# Patient Record
Sex: Male | Born: 1945 | Race: White | Hispanic: No | Marital: Married | Smoking: Former smoker
Health system: Southern US, Community
[De-identification: ages and names within clinical notes are randomized; demographics above are authoritative.]

## PROBLEM LIST (undated history)

## (undated) DIAGNOSIS — N289 Disorder of kidney and ureter, unspecified: Secondary | ICD-10-CM

## (undated) DIAGNOSIS — J449 Chronic obstructive pulmonary disease, unspecified: Secondary | ICD-10-CM

## (undated) DIAGNOSIS — I10 Essential (primary) hypertension: Secondary | ICD-10-CM

---

## 2010-09-06 ENCOUNTER — Ambulatory Visit: Payer: Self-pay | Admitting: Nurse Practitioner

## 2010-09-06 IMAGING — CR DG CHEST 2V
1 series · 3 of 3 positions shown · non-contrast
Comparison: none

REASON FOR EXAM: asymmetric rightsided densities on cxr in September 2008
COMMENTS:

[Series 1: view not recorded · 0.17mm/px · 3 of 3 slices shown]
[im 1/3]
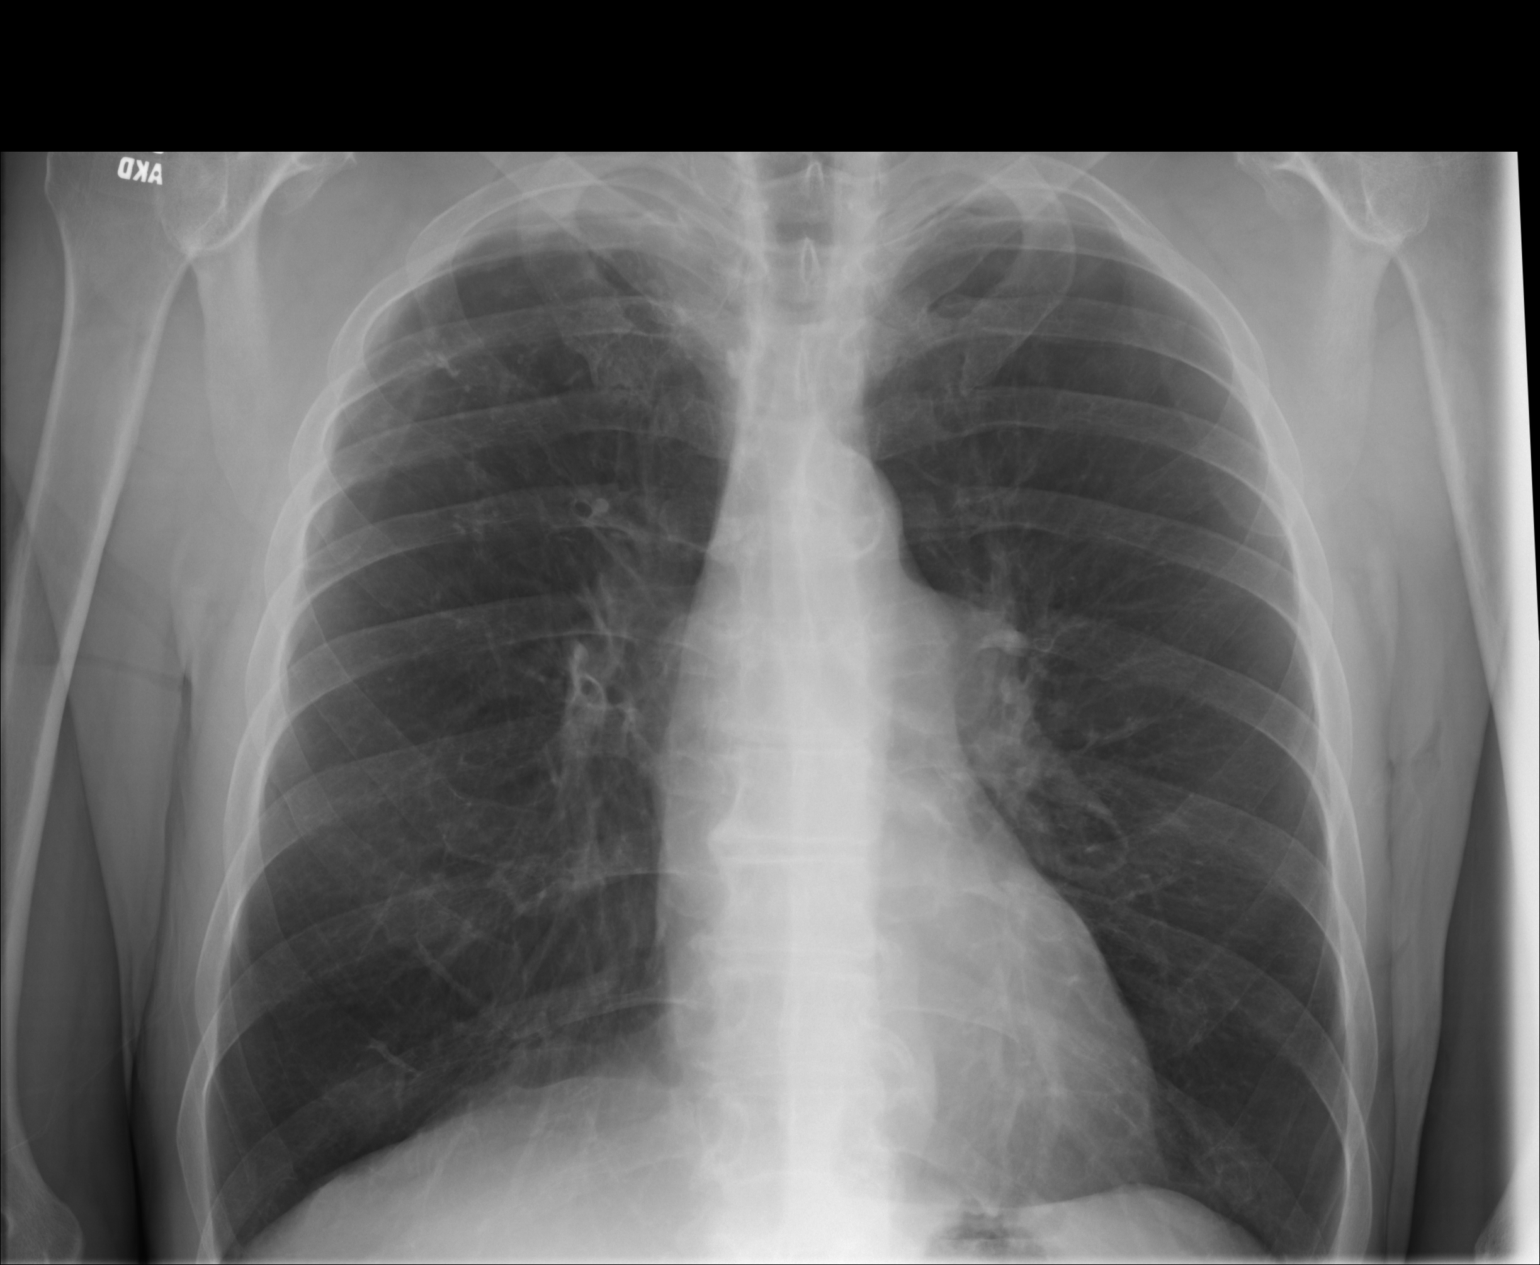
[im 2/3]
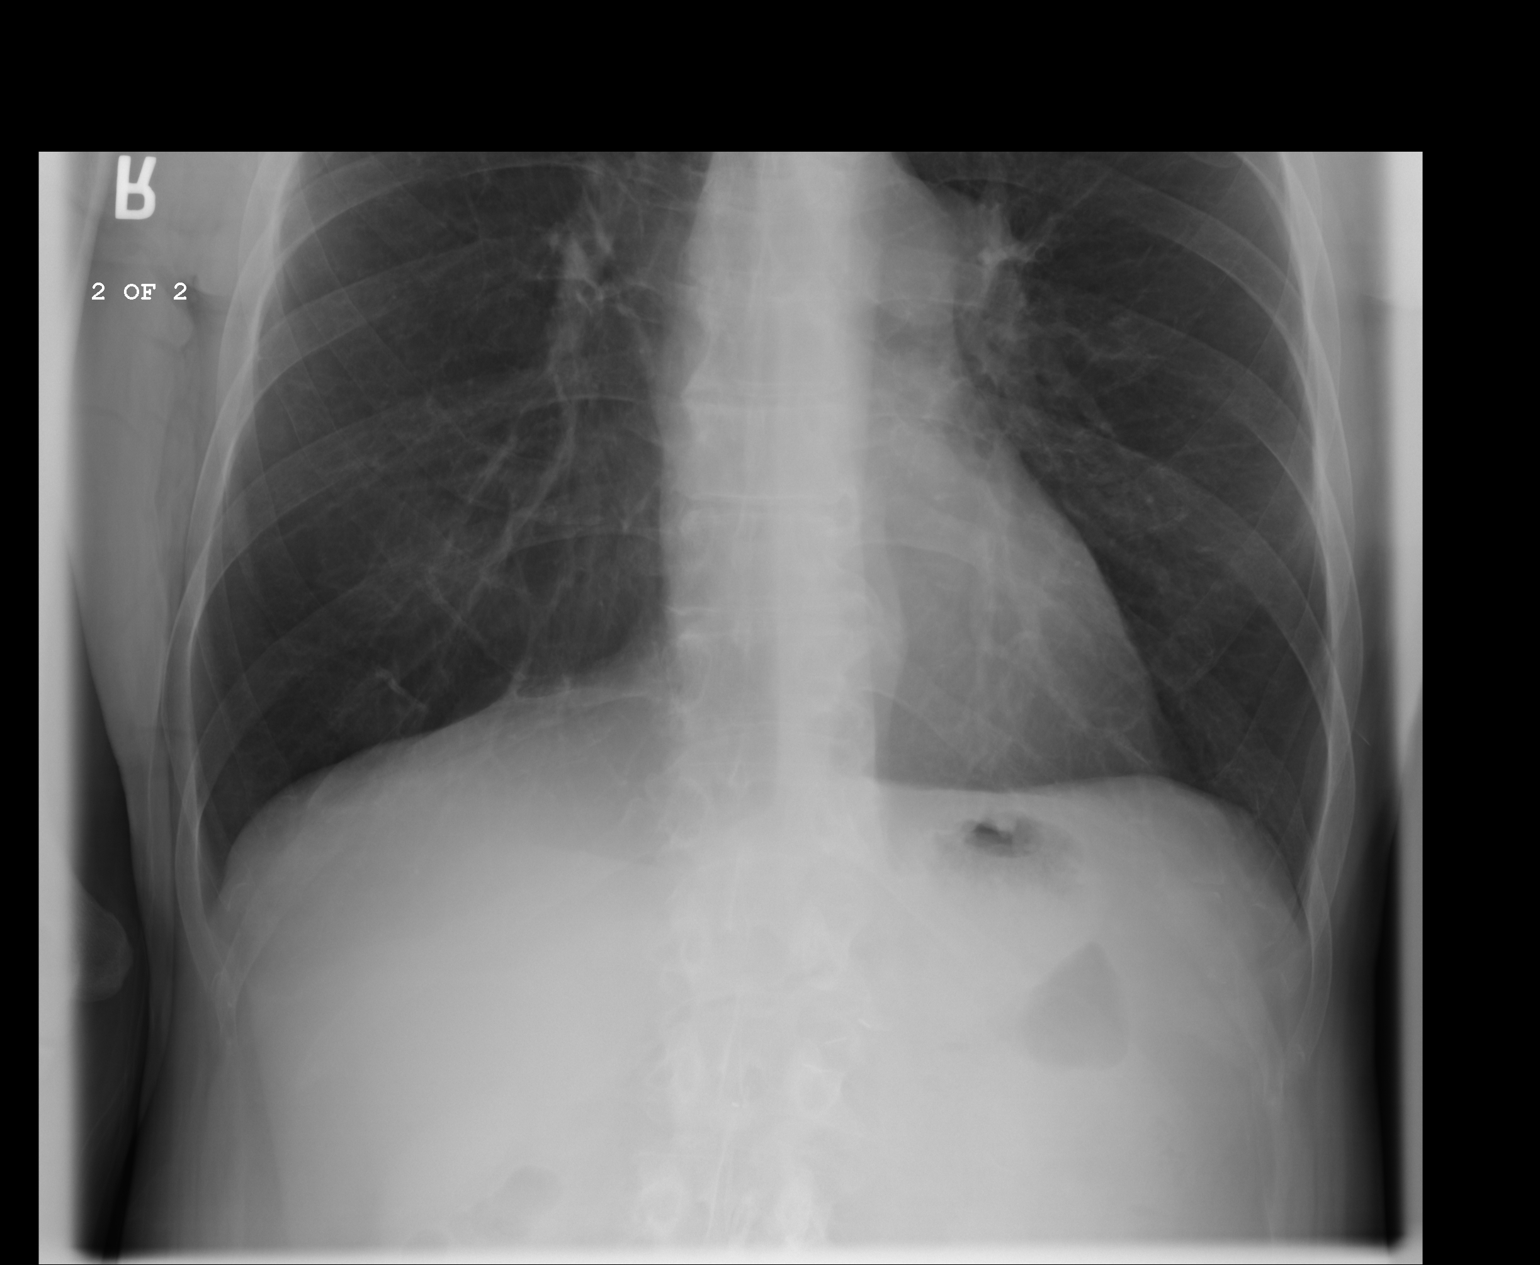
[im 3/3]
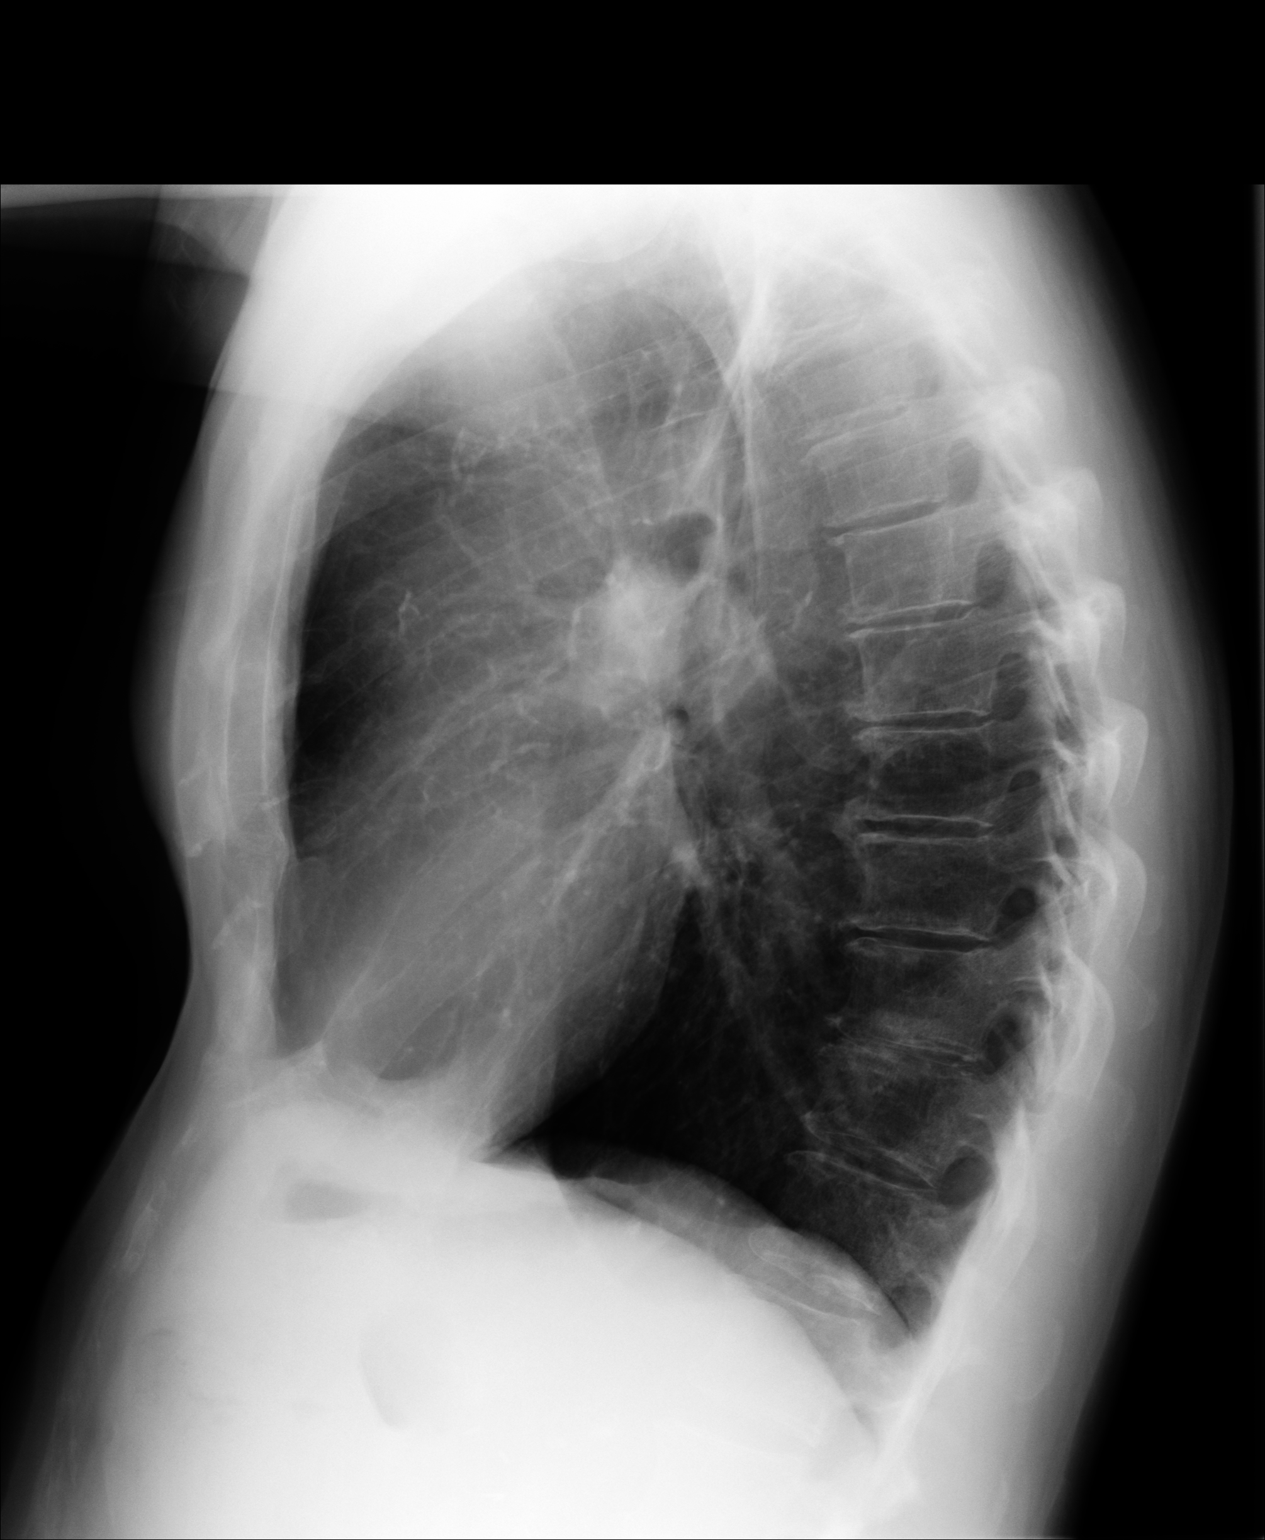

[3 of 3 positions shown; findings below may reference images not displayed]

PROCEDURE:     DXR - DXR CHEST PA (OR AP) AND LATERAL  - September 06, 2010  [DATE]

RESULT:     There is no previous exam for comparison.

The lungs are clear. The heart and pulmonary vessels are normal. The bony
and mediastinal structures are unremarkable. There is no effusion. There is
no pneumothorax or evidence of congestive failure. There is hyperinflation
which may reflect reactive airway disease or COPD.
IMPRESSION: No acute cardiopulmonary disease. Hyperinflation most
likely secondary to COPD.

## 2018-07-25 ENCOUNTER — Emergency Department: Payer: Medicare HMO

## 2018-07-25 ENCOUNTER — Encounter: Payer: Self-pay | Admitting: Emergency Medicine

## 2018-07-25 ENCOUNTER — Other Ambulatory Visit: Payer: Self-pay

## 2018-07-25 ENCOUNTER — Observation Stay
Admission: EM | Admit: 2018-07-25 | Discharge: 2018-07-26 | Disposition: A | Discharge status: Home / Self Care | Payer: Medicare HMO | Attending: Internal Medicine | Admitting: Internal Medicine

## 2018-07-25 DIAGNOSIS — Z66 Do not resuscitate: Secondary | ICD-10-CM | POA: Diagnosis not present

## 2018-07-25 DIAGNOSIS — I129 Hypertensive chronic kidney disease with stage 1 through stage 4 chronic kidney disease, or unspecified chronic kidney disease: Secondary | ICD-10-CM | POA: Insufficient documentation

## 2018-07-25 DIAGNOSIS — E860 Dehydration: Secondary | ICD-10-CM | POA: Insufficient documentation

## 2018-07-25 DIAGNOSIS — R918 Other nonspecific abnormal finding of lung field: Secondary | ICD-10-CM | POA: Insufficient documentation

## 2018-07-25 DIAGNOSIS — E875 Hyperkalemia: Principal | ICD-10-CM | POA: Insufficient documentation

## 2018-07-25 DIAGNOSIS — E871 Hypo-osmolality and hyponatremia: Secondary | ICD-10-CM | POA: Diagnosis not present

## 2018-07-25 DIAGNOSIS — E876 Hypokalemia: Secondary | ICD-10-CM

## 2018-07-25 DIAGNOSIS — Z87891 Personal history of nicotine dependence: Secondary | ICD-10-CM | POA: Diagnosis not present

## 2018-07-25 DIAGNOSIS — R911 Solitary pulmonary nodule: Secondary | ICD-10-CM

## 2018-07-25 DIAGNOSIS — N183 Chronic kidney disease, stage 3 (moderate): Secondary | ICD-10-CM | POA: Diagnosis not present

## 2018-07-25 HISTORY — DX: Disorder of kidney and ureter, unspecified: N28.9

## 2018-07-25 HISTORY — DX: Essential (primary) hypertension: I10

## 2018-07-25 LAB — CBC
HCT: 37.5 % — ABNORMAL LOW (ref 39.0–52.0)
Hemoglobin: 13.5 g/dL (ref 13.0–17.0)
MCH: 40.1 pg — AB (ref 26.0–34.0)
MCHC: 36 g/dL (ref 30.0–36.0)
MCV: 111.3 fL — ABNORMAL HIGH (ref 80.0–100.0)
Platelets: 298 10*3/uL (ref 150–400)
RBC: 3.37 MIL/uL — ABNORMAL LOW (ref 4.22–5.81)
RDW: 12.4 % (ref 11.5–15.5)
WBC: 7.2 10*3/uL (ref 4.0–10.5)
nRBC: 0 % (ref 0.0–0.2)

## 2018-07-25 LAB — COMPREHENSIVE METABOLIC PANEL
ALBUMIN: 3.3 g/dL — AB (ref 3.5–5.0)
ALK PHOS: 73 U/L (ref 38–126)
ALT: 12 U/L (ref 0–44)
AST: 26 U/L (ref 15–41)
Anion gap: 10 (ref 5–15)
BILIRUBIN TOTAL: 1 mg/dL (ref 0.3–1.2)
BUN: 19 mg/dL (ref 8–23)
CALCIUM: 8.1 mg/dL — AB (ref 8.9–10.3)
CO2: 21 mmol/L — ABNORMAL LOW (ref 22–32)
CREATININE: 1.35 mg/dL — AB (ref 0.61–1.24)
Chloride: 94 mmol/L — ABNORMAL LOW (ref 98–111)
GFR calc non Af Amer: 52 mL/min — ABNORMAL LOW (ref >60)
GLUCOSE: 107 mg/dL — AB (ref 70–99)
POTASSIUM: 5.3 mmol/L — AB (ref 3.5–5.1)
SODIUM: 125 mmol/L — AB (ref 135–145)
Total Protein: 6.3 g/dL — ABNORMAL LOW (ref 6.5–8.1)

## 2018-07-25 LAB — TROPONIN I: Troponin I: <0.03 ng/mL (ref <0.03)

## 2018-07-25 IMAGING — CR DG CHEST 2V
2 series · 2 of 2 positions shown · non-contrast
Comparison: 09/06/2010

CLINICAL DATA: Hyponatremia

EXAM:
CHEST - 2 VIEW

[chest lat]
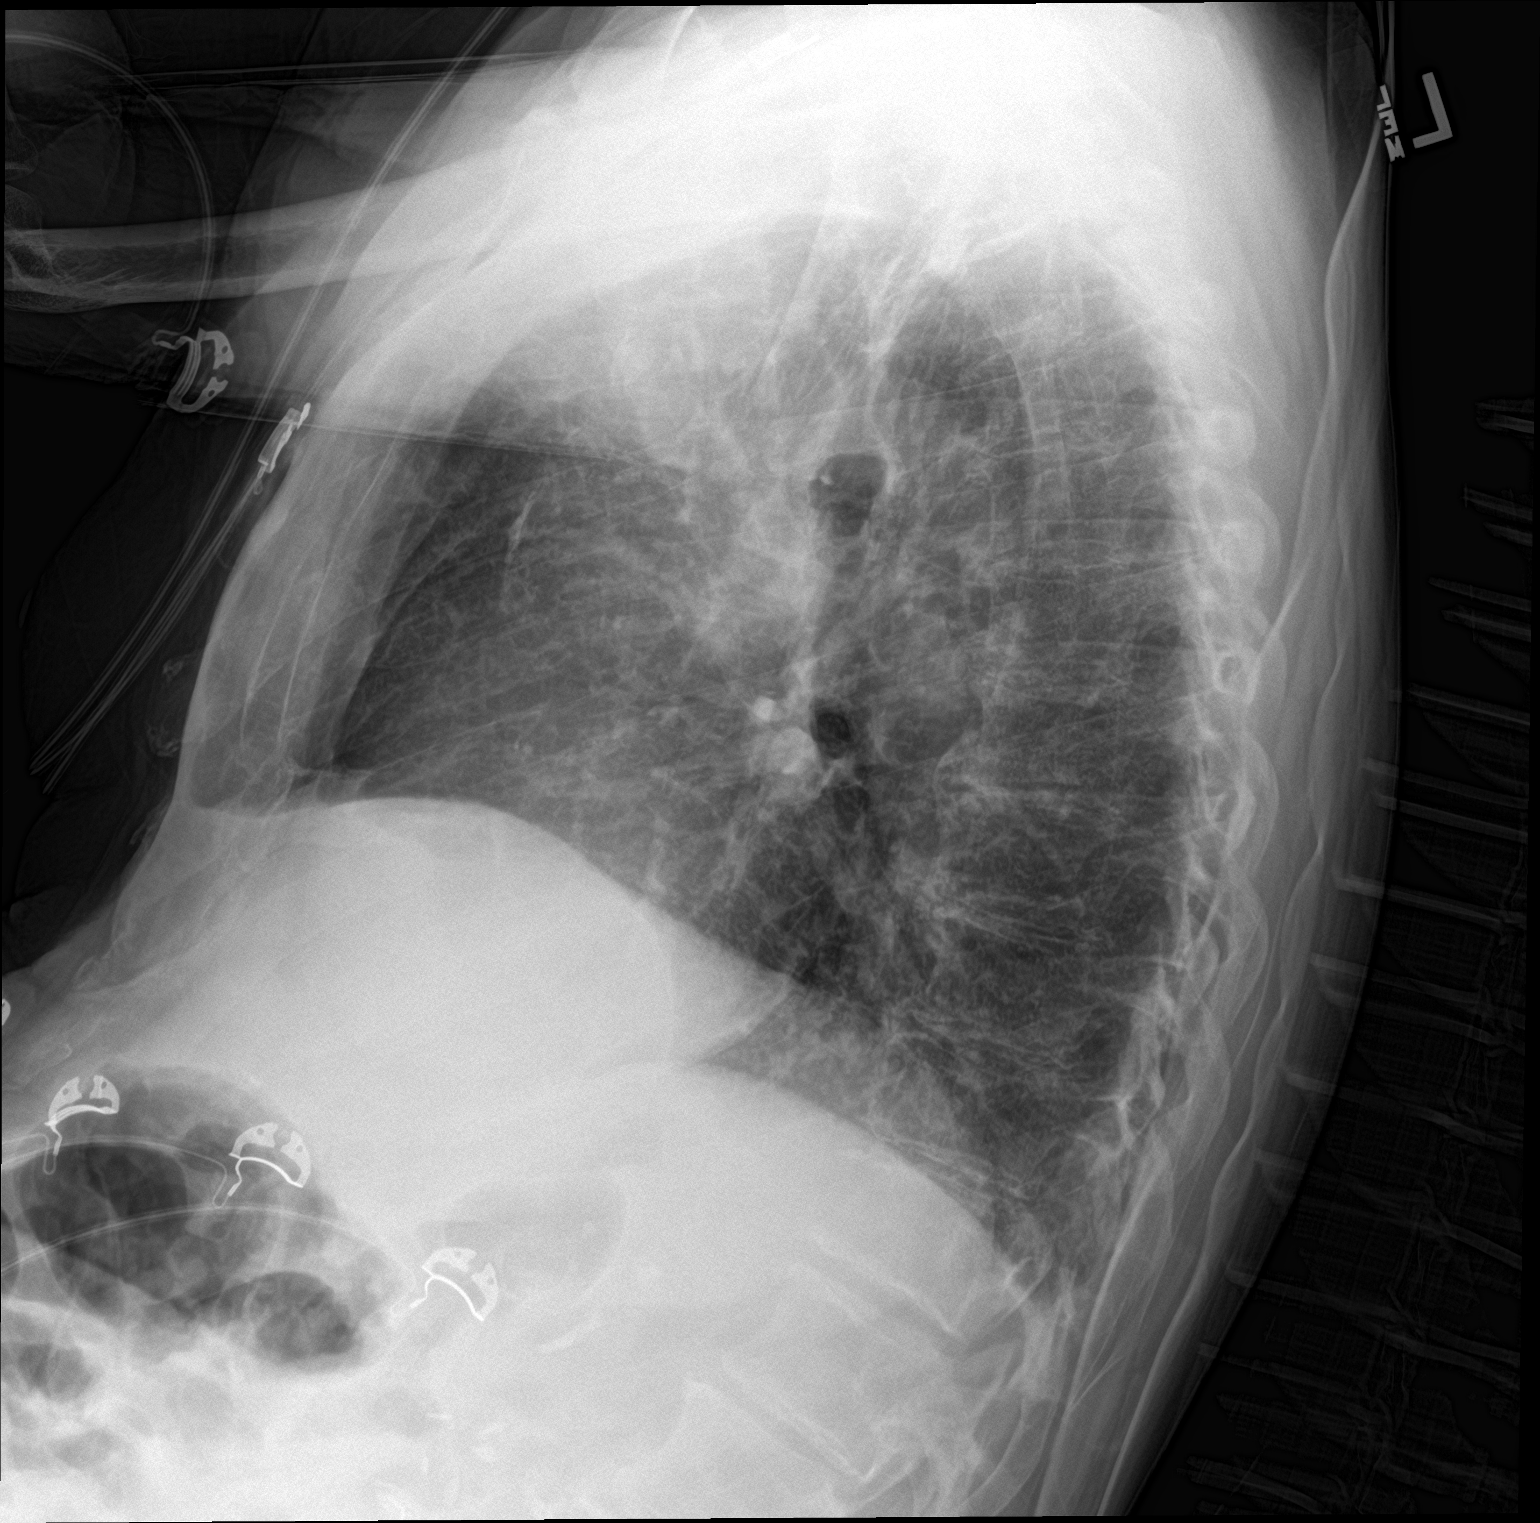

[chest ap]
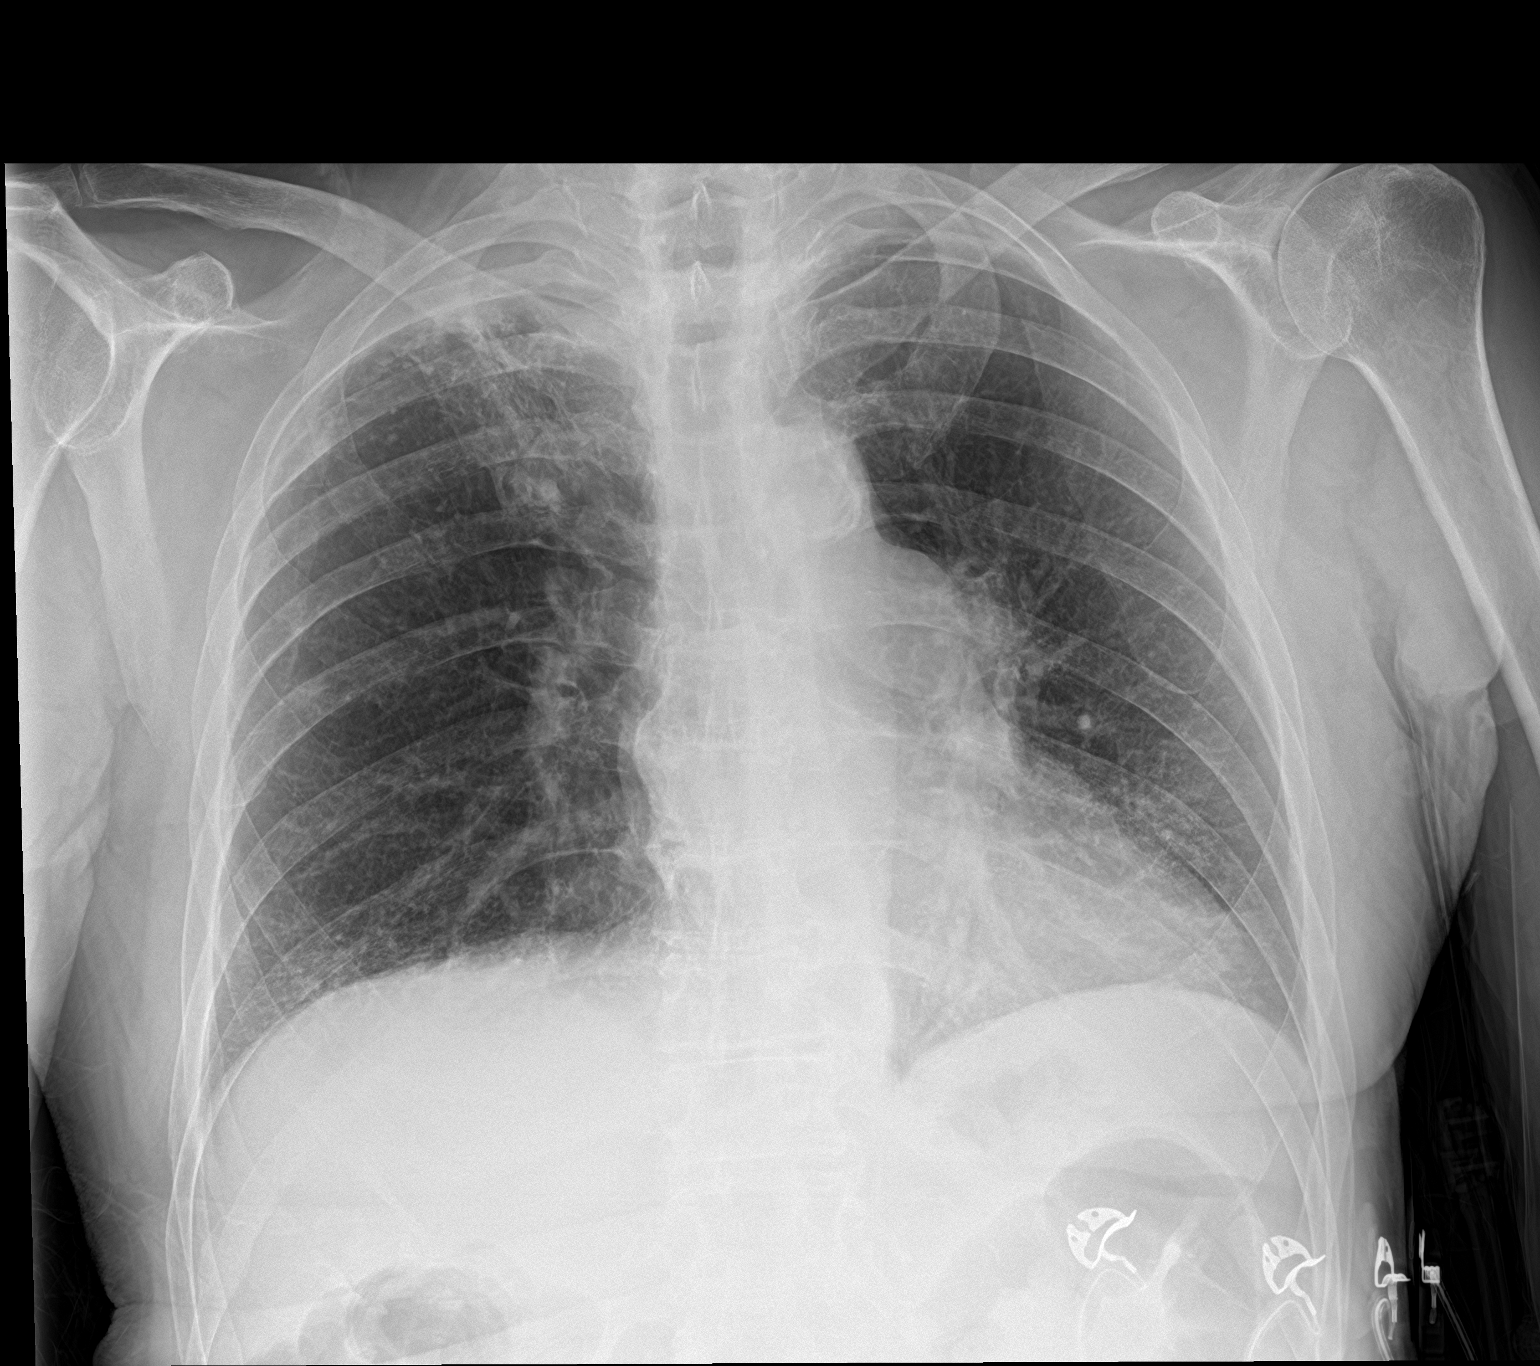

[2 of 2 positions shown; findings below may reference images not displayed]

FINDINGS: Progressed right apical pleural and parenchymal disease. Left lung
is clear. Normal heart size with aortic atherosclerosis. No
pneumothorax. Degenerative changes of the spine.
IMPRESSION: Progressed right apical pleural and parenchymal opacity since 4804
comparison. Given history, recommend CT chest for further
evaluation.

## 2018-07-25 IMAGING — CT CT CHEST W/ CM
2 of 3 series · 15 of 36 positions shown, 18 images · IV contrast (omnipaque)
Comparison: 07/25/2018 chest radiograph

CLINICAL DATA: 72 y/o M; right lung apex pleuroparenchymal opacity.

EXAM:
CT CHEST WITH CONTRAST
TECHNIQUE: Multidetector CT imaging of the chest was performed during
intravenous contrast administration.
CONTRAST:  75mL OMNIPAQUE IOHEXOL 300 MG/ML  SOLN

[Series 2: axial st · axial · 0.61mm/px · z∈[-510,-232]mm · 12 of 165 slices shown, 15 images]
[im 13/165  mediastinal]
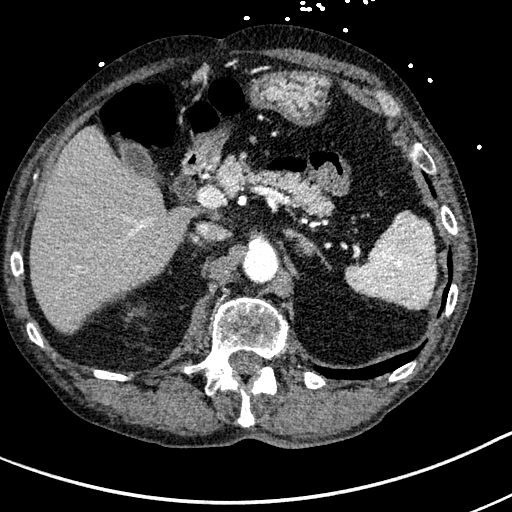
[im 13/165  lung]
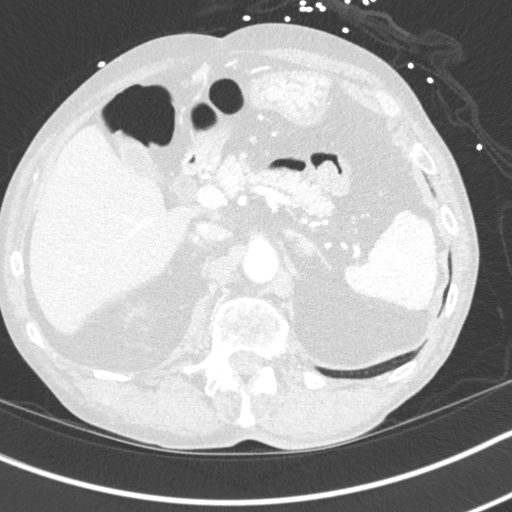
[im 25/165  lung]
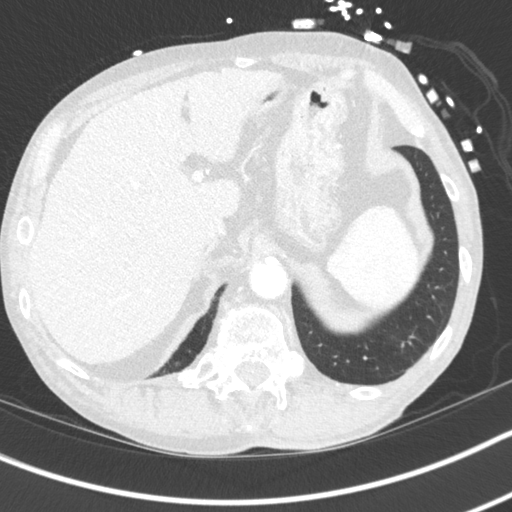
[im 37/165  lung]
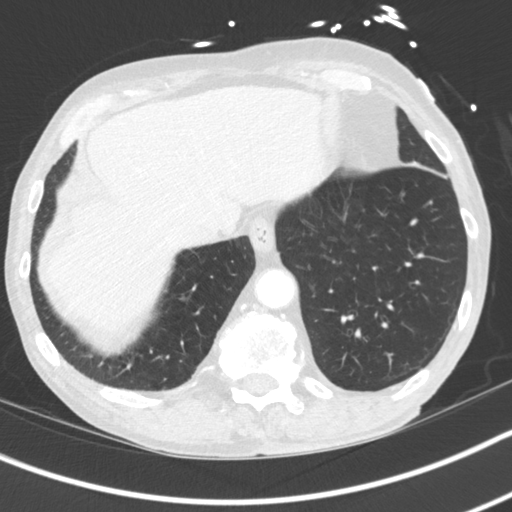
[im 49/165  lung]
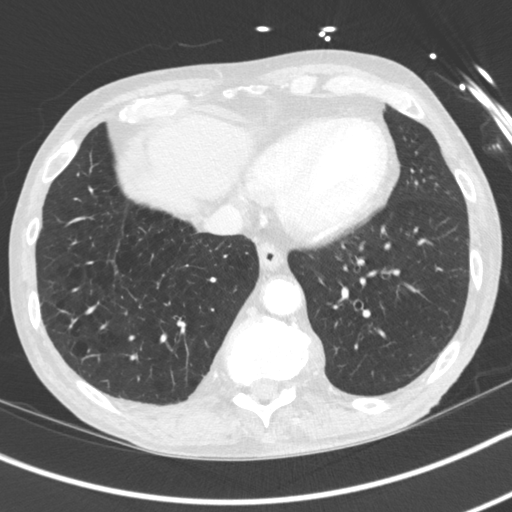
[im 61/165  mediastinal]
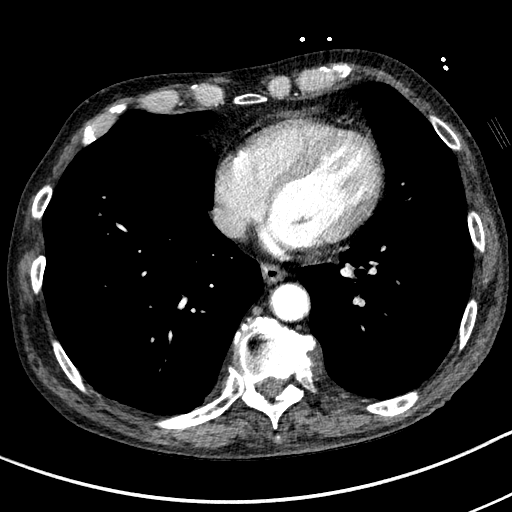
[im 61/165  lung]
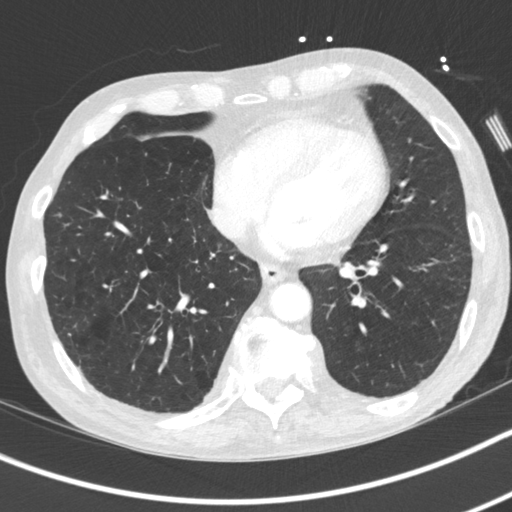
[im 73/165  lung]
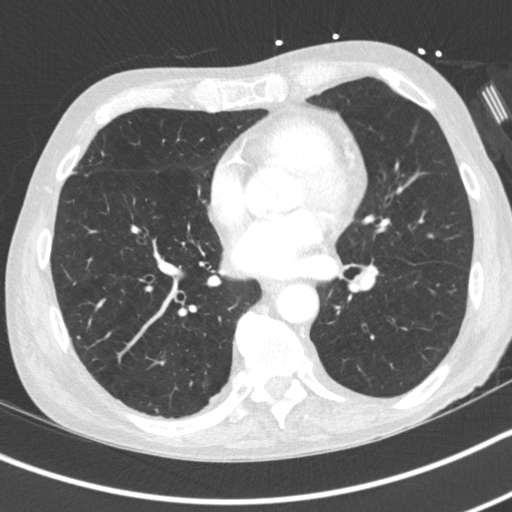
[im 92/165  lung]
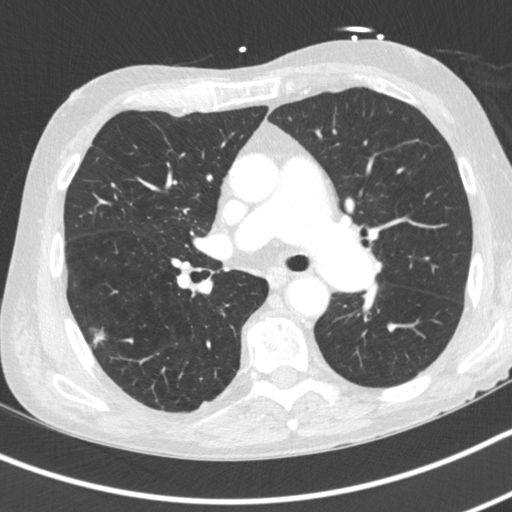
[im 104/165  lung]
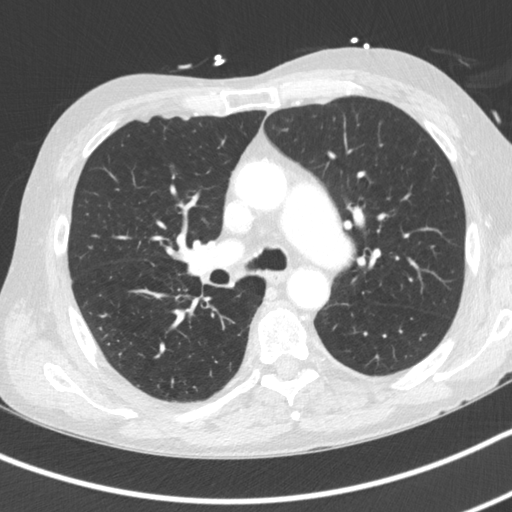
[im 116/165  mediastinal]
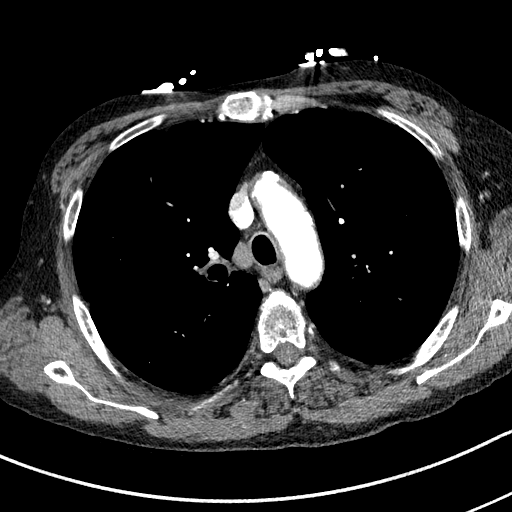
[im 116/165  lung]
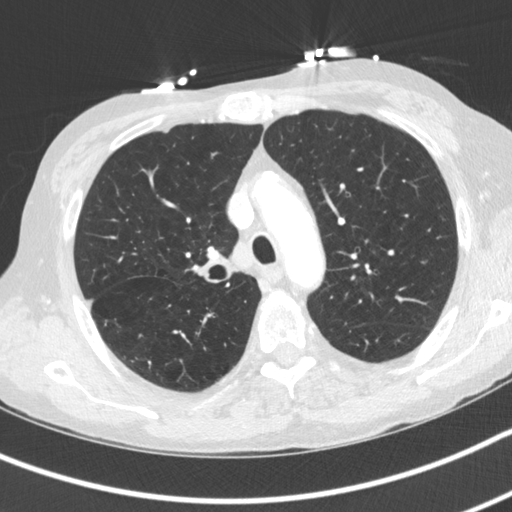
[im 128/165  lung]
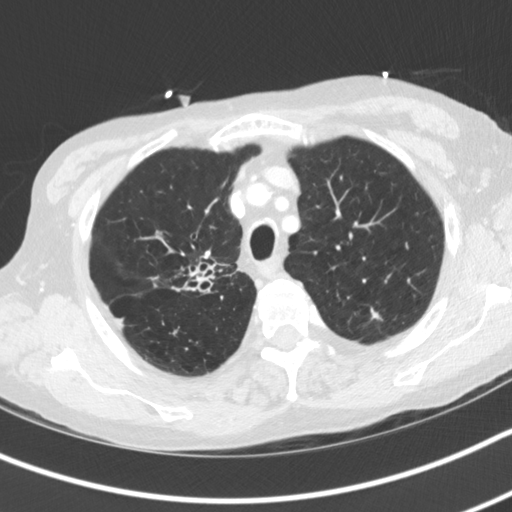
[im 140/165  lung]
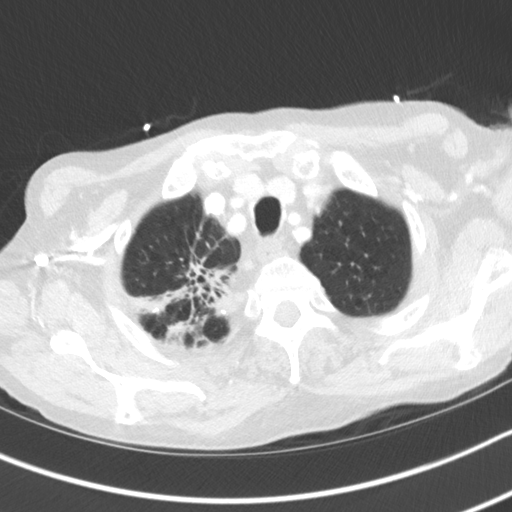
[im 152/165  lung]
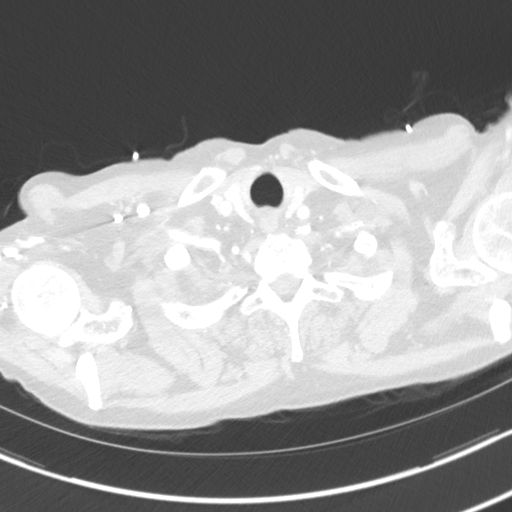

[Series 5: coronal · coronal · 0.60mm/px · 3 of 121 slices shown]
[im 25/121  lung]
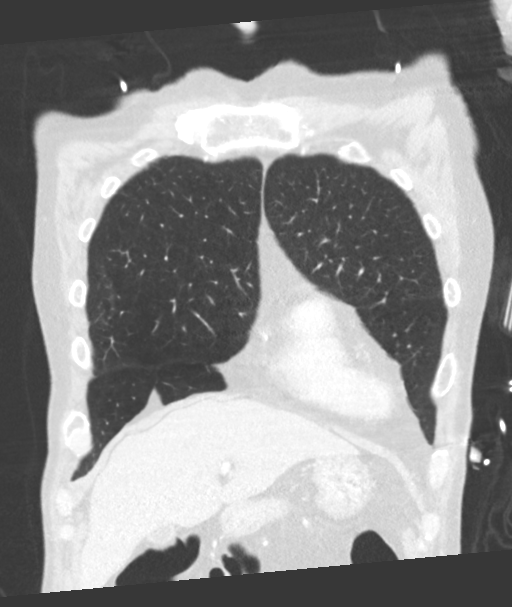
[im 49/121  lung]
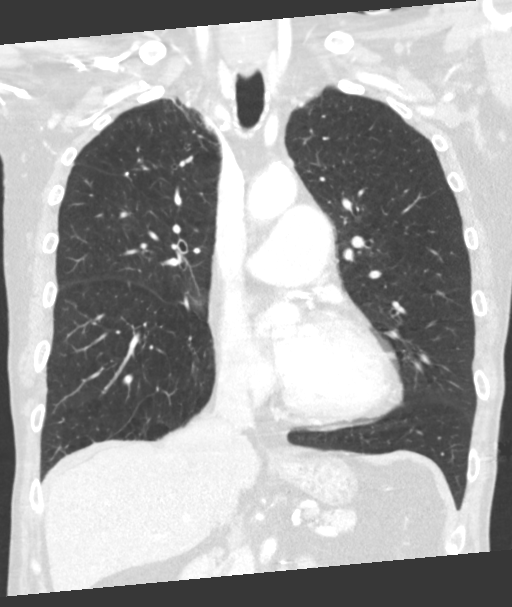
[im 73/121  lung]
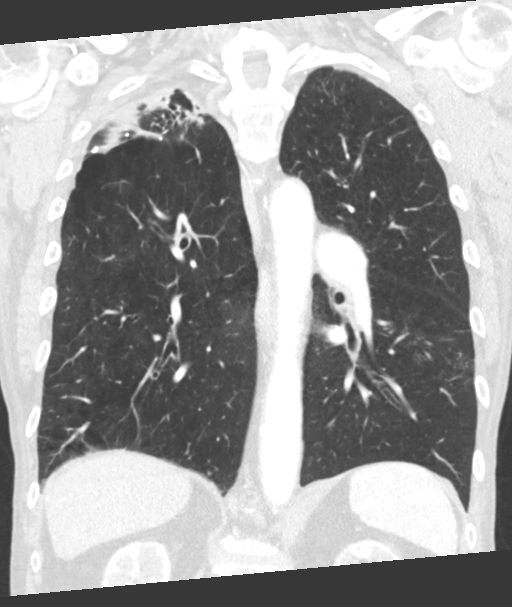

[15 of 36 positions shown; findings below may reference images not displayed]

FINDINGS: Cardiovascular: Normal caliber thoracic aorta and main pulmonary
artery. Normal heart size. No pericardial effusion. Severe coronary
artery calcific atherosclerosis. Moderate calcific atherosclerosis
of the aorta. Calcified plaque of the right brachiocephalic artery
bifurcation with mild stenosis of the subclavian artery and common
carotid artery origins.

Mediastinum/Nodes: No enlarged mediastinal, hilar, or axillary lymph
nodes. Thyroid gland, trachea, and esophagus demonstrate no
significant findings.

Lungs/Pleura: 15 x 11 mm nodule in the right lower lobe (series 3,
image 72) with spiculation. 5 x 5 mm nodule in the left upper lobe
(series 3, image 39). Several additional scattered 2-3 mm nodules
are present at the peripheries of the lungs. Emphysema. No effusion
or pneumothorax. Right lung apex opacity corresponds to focal severe
bronchiectasis and segmental scarring of the right upper lobe.

Upper Abdomen: Partially visualize cyst measuring 12 mm within the
left interpolar kidney. Cholelithiasis.

Musculoskeletal: Multiple chronic right-sided rib fractures.
IMPRESSION: 1. Right lung apex opacity on radiograph corresponds to severe focal
bronchiectasis and segmental scarring of the right upper lobe.
2. 15 mm right lower lobe pulmonary nodule with spiculated margins.
Consider one of the following in 3 months for both low-risk and
high-risk individuals: (a) repeat chest CT, (b) follow-up PET-CT, or
(c) tissue sampling. This recommendation follows the consensus
statement: Guidelines for Management of Incidental Pulmonary Nodules
Detected on CT Images: From the [HOSPITAL] 5254; Radiology
3. Severe coronary artery calcific atherosclerosis. Aortic
Atherosclerosis (186AH-NA7.7) and Emphysema (186AH-TS1.D).

## 2018-07-25 MED ORDER — ACETAMINOPHEN 325 MG PO TABS: 650.0000 mg | ORAL_TABLET | Freq: Four times a day (QID) | ORAL | Status: DC | PRN

## 2018-07-25 MED ORDER — AMLODIPINE BESYLATE 5 MG PO TABS: 10.0000 mg | ORAL_TABLET | Freq: Every day | ORAL | Status: DC

## 2018-07-25 MED ORDER — SODIUM CHLORIDE 0.9 % IV SOLN
INTRAVENOUS | Status: AC
  Administered 2018-07-26: 03:00:00 via INTRAVENOUS

## 2018-07-25 MED ORDER — ONDANSETRON HCL 4 MG PO TABS: 4.0000 mg | ORAL_TABLET | Freq: Four times a day (QID) | ORAL | Status: DC | PRN

## 2018-07-25 MED ORDER — ONDANSETRON HCL 4 MG/2ML IJ SOLN: 4.0000 mg | Freq: Four times a day (QID) | INTRAMUSCULAR | Status: DC | PRN

## 2018-07-25 MED ORDER — ENOXAPARIN SODIUM 40 MG/0.4ML ~~LOC~~ SOLN
40.0000 mg | SUBCUTANEOUS | Status: DC
  Administered 2018-07-25: 40 mg via SUBCUTANEOUS
  Filled 2018-07-25: qty 0.4

## 2018-07-25 MED ORDER — SODIUM CHLORIDE 0.9 % IV BOLUS
1000.0000 mL | Freq: Once | INTRAVENOUS | Status: AC
  Administered 2018-07-25: 1000 mL via INTRAVENOUS

## 2018-07-25 MED ORDER — HYDRALAZINE HCL 20 MG/ML IJ SOLN: 10.0000 mg | Freq: Four times a day (QID) | INTRAMUSCULAR | Status: DC | PRN

## 2018-07-25 MED ORDER — AMLODIPINE BESYLATE 5 MG PO TABS
5.0000 mg | ORAL_TABLET | Freq: Every day | ORAL | Status: DC
  Administered 2018-07-25 – 2018-07-26 (×2): 5 mg via ORAL
  Filled 2018-07-25 (×3): qty 1

## 2018-07-25 MED ORDER — ACETAMINOPHEN 650 MG RE SUPP: 650.0000 mg | Freq: Four times a day (QID) | RECTAL | Status: DC | PRN

## 2018-07-25 MED ORDER — ALBUTEROL SULFATE (2.5 MG/3ML) 0.083% IN NEBU: 2.5000 mg | INHALATION_SOLUTION | RESPIRATORY_TRACT | Status: DC | PRN

## 2018-07-25 MED ORDER — SODIUM CHLORIDE 0.9 % IV SOLN
Freq: Once | INTRAVENOUS | Status: AC
  Administered 2018-07-25: 19:00:00 via INTRAVENOUS

## 2018-07-25 MED ORDER — IOHEXOL 300 MG/ML  SOLN
75.0000 mL | Freq: Once | INTRAMUSCULAR | Status: AC | PRN
  Administered 2018-07-25: 75 mL via INTRAVENOUS

## 2018-07-25 MED ORDER — SODIUM CHLORIDE 0.9% FLUSH: 3.0000 mL | Freq: Two times a day (BID) | INTRAVENOUS | Status: DC

## 2018-07-25 MED ORDER — POLYETHYLENE GLYCOL 3350 17 G PO PACK: 17.0000 g | PACK | Freq: Every day | ORAL | Status: DC | PRN

## 2018-07-25 NOTE — ED Triage Notes (Signed)
Correction: Pt has low Na and high K per blood work drawn today.

## 2018-07-25 NOTE — Progress Notes (Signed)
Advance care planning  Purpose of Encounter Hypertension complications, CODE STATUS  Parties in Attendance Patient and his wife who is the healthcare power of attorney is at bedside  Patients Decisional capacity Patient is alert and oriented.  Able to make medical decisions.  Patient has documented healthcare power of attorney.  Does not have advanced directives in place.  Encouraged to finish documentation.  We discussed regarding hypertension and its need for treatment to avoid long-term complications.  All questions answered.  Patient's CODE STATUS discussed.  He mentions that he does not want to be on machines and does not want CPR.  CODE STATUS changed and DO NOT RESUSCITATE and DO NOT INTUBATE status entered.  Time spent - 17 minutes

## 2018-07-25 NOTE — ED Notes (Signed)
Patient transported to X-ray 

## 2018-07-25 NOTE — H&P (Signed)
[facility] [place]   PATIENT NAME: Nathan Sellers    MR#:  [id number]  DATE OF BIRTH:  [birth year]  DATE OF ADMISSION:  07/25/2018  PRIMARY CARE PHYSICIAN: Wilford Corner, PA-C   REQUESTING/REFERRING PHYSICIAN: Dr. Lenard Lance  CHIEF COMPLAINT:   Chief Complaint  Patient presents with  . Hypotension  . Hyponatremia    HISTORY OF PRESENT ILLNESS:  [name]  is a 72 y.o. male with a known history of hypertension who was recently started on lisinopril and hydrochlorothiazide 2 weeks back presents to the emergency room due to hyponatremia and hyperkalemia found on his BMP checked by primary care physician.  Patient was started on 2 tablets of lisinopril and hydrochlorothiazide which was cut in half due to dizziness and weakness and finally stopped.  He has not taken the medication in 1 week.  He does drink 2 drinks of whiskey every day.  Which is not new.  PAST MEDICAL HISTORY:   Past Medical History:  Diagnosis Date  . Hypertension   . Renal disorder     PAST SURGICAL HISTORY:  History reviewed. No pertinent surgical history.  SOCIAL HISTORY:   Social History   Tobacco Use  . Smoking status: Former Games developer  . Smokeless tobacco: Never Used  Substance Use Topics  . Alcohol use: Yes    Alcohol/week: 3.0 standard drinks    Types: 3 Shots of liquor per week    FAMILY HISTORY:   Family History  Problem Relation Age of Onset  . Hypertension Other   . CAD Mother   . Kidney disease Mother   . Alzheimer's disease Mother     DRUG ALLERGIES:   Allergies  Allergen Reactions  . Penicillins Hives    REVIEW OF SYSTEMS:   Review of Systems  Constitutional: Positive for malaise/fatigue. Negative for chills and fever.  HENT: Negative for sore throat.   Eyes: Negative for blurred vision, double vision and pain.  Respiratory: Negative for cough, hemoptysis, shortness of breath and wheezing.   Cardiovascular: Negative for chest pain,  palpitations, orthopnea and leg swelling.  Gastrointestinal: Negative for abdominal pain, constipation, diarrhea, heartburn, nausea and vomiting.  Genitourinary: Negative for dysuria and hematuria.  Musculoskeletal: Negative for back pain and joint pain.  Skin: Negative for rash.  Neurological: Positive for dizziness. Negative for sensory change, speech change, focal weakness and headaches.  Endo/Heme/Allergies: Does not bruise/bleed easily.  Psychiatric/Behavioral: Negative for depression. The patient is not nervous/anxious.     MEDICATIONS AT HOME:   Prior to Admission medications   Not on File     VITAL SIGNS:  Blood pressure (!) 171/97, pulse (!) 106, temperature 98 F (36.7 C), temperature source Oral, resp. rate 19, height 5\' 6"  (1.676 m), weight 58.5 kg, SpO2 99 %.  PHYSICAL EXAMINATION:  Physical Exam  GENERAL:  72 y.o.-year-old patient lying in the bed with no acute distress.  EYES: Pupils equal, round, reactive to light and accommodation. No scleral icterus. Extraocular muscles intact.  HEENT: Head atraumatic, normocephalic. Oropharynx and nasopharynx clear. No oropharyngeal erythema, moist oral mucosa  NECK:  Supple, no jugular venous distention. No thyroid enlargement, no tenderness.  LUNGS: Normal breath sounds bilaterally, no wheezing, rales, rhonchi. No use of accessory muscles of respiration.  CARDIOVASCULAR: S1, S2 normal. No murmurs, rubs, or gallops.  ABDOMEN: Soft, nontender, nondistended. Bowel sounds present. No organomegaly or mass.  EXTREMITIES: No pedal edema, cyanosis, or clubbing. + 2 pedal & radial pulses b/l.   NEUROLOGIC:  Cranial nerves II through XII are intact. No focal Motor or sensory deficits appreciated b/l PSYCHIATRIC: The patient is alert and oriented x 3. Good affect.  SKIN: No obvious rash, lesion, or ulcer.   LABORATORY PANEL:   CBC Recent Labs  Lab 07/25/18 1721  WBC 7.2  HGB 13.5  HCT 37.5*  PLT 298    ------------------------------------------------------------------------------------------------------------------  Chemistries  Recent Labs  Lab 07/25/18 1721  NA 125*  K 5.3*  CL 94*  CO2 21*  GLUCOSE 107*  BUN 19  CREATININE 1.35*  CALCIUM 8.1*  AST 26  ALT 12  ALKPHOS 73  BILITOT 1.0   ------------------------------------------------------------------------------------------------------------------  Cardiac Enzymes Recent Labs  Lab 07/25/18 1721  TROPONINI <0.03   ------------------------------------------------------------------------------------------------------------------  RADIOLOGY:  Dg Chest 2 View  Result Date: 07/25/2018 CLINICAL DATA:  Hyponatremia EXAM: CHEST - 2 VIEW COMPARISON:  09/06/2010 FINDINGS: Progressed right apical pleural and parenchymal disease. Left lung is clear. Normal heart size with aortic atherosclerosis. No pneumothorax. Degenerative changes of the spine. IMPRESSION: Progressed right apical pleural and parenchymal opacity since 2012 comparison. Given history, recommend CT chest for further evaluation. Electronically Signed   By: Jasmine PangKim  Fujinaga M.D.   On: 07/25/2018 17:51   Ct Chest W Contrast  Result Date: 07/25/2018 CLINICAL DATA:  72 y/o M; right lung apex pleuroparenchymal opacity. EXAM: CT CHEST WITH CONTRAST TECHNIQUE: Multidetector CT imaging of the chest was performed during intravenous contrast administration. CONTRAST:  75mL OMNIPAQUE IOHEXOL 300 MG/ML  SOLN COMPARISON:  07/25/2018 chest radiograph FINDINGS: Cardiovascular: Normal caliber thoracic aorta and main pulmonary artery. Normal heart size. No pericardial effusion. Severe coronary artery calcific atherosclerosis. Moderate calcific atherosclerosis of the aorta. Calcified plaque of the right brachiocephalic artery bifurcation with mild stenosis of the subclavian artery and common carotid artery origins. Mediastinum/Nodes: No enlarged mediastinal, hilar, or axillary lymph nodes.  Thyroid gland, trachea, and esophagus demonstrate no significant findings. Lungs/Pleura: 15 x 11 mm nodule in the right lower lobe (series 3, image 72) with spiculation. 5 x 5 mm nodule in the left upper lobe (series 3, image 39). Several additional scattered 2-3 mm nodules are present at the peripheries of the lungs. Emphysema. No effusion or pneumothorax. Right lung apex opacity corresponds to focal severe bronchiectasis and segmental scarring of the right upper lobe. Upper Abdomen: Partially visualize cyst measuring 12 mm within the left interpolar kidney. Cholelithiasis. Musculoskeletal: Multiple chronic right-sided rib fractures. IMPRESSION: 1. Right lung apex opacity on radiograph corresponds to severe focal bronchiectasis and segmental scarring of the right upper lobe. 2. 15 mm right lower lobe pulmonary nodule with spiculated margins. Consider one of the following in 3 months for both low-risk and high-risk individuals: (a) repeat chest CT, (b) follow-up PET-CT, or (c) tissue sampling. This recommendation follows the consensus statement: Guidelines for Management of Incidental Pulmonary Nodules Detected on CT Images: From the Fleischner Society 2017; Radiology 2017; 284:228-243. 3. Severe coronary artery calcific atherosclerosis. Aortic Atherosclerosis (ICD10-I70.0) and Emphysema (ICD10-J43.9). Electronically Signed   By: Mitzi HansenLance  Furusawa-Stratton M.D.   On: 07/25/2018 19:18     IMPRESSION AND PLAN:   *Hyperkalemia and hyponatremia likely due to lisinopril and hydrochlorothiazide combination.  Hold medication.  Will start IV fluids.  Telemetry monitoring.  Repeat BMP in the morning.  *Hypertension.  Will start Norvasc 5 mg.  If uncontrolled can add low-dose metoprolol.  Would avoid ACE inhibitors and hydrochlorothiazide.  *Dehydration.  Likely due to hydrochlorothiazide.  Start IV fluids.  *CKD stage III is stable  DVT prophylaxis with  Lovenox  All the records are reviewed and case discussed  with ED provider. Management plans discussed with the patient, family and they are in agreement.  CODE STATUS: DNR  TOTAL TIME TAKING CARE OF THIS PATIENT: 35 minutes.   Orie Fisherman M.D on 07/25/2018 at 8:21 PM  Between 7am to 6pm - Pager - [phone]  After 6pm go to [email or web address] - password [email or web address] [facility]  [facility]  Office  [phone]  CC: Primary care physician; Whitaker, Jonnie Finner, PA-C  Note: This dictation was prepared with Dragon dictation along with smaller phrase technology. Any transcriptional errors that result from this process are unintentional.

## 2018-07-25 NOTE — ED Provider Notes (Signed)
[facility] Emergency Department Provider Note  Time seen: 5:06 PM  I have reviewed the triage vital signs and the nursing notes.   HISTORY  Chief Complaint Hypotension and Hyponatremia    HPI Roger ShelterJohn W Marcou is a 72 y.o. male with a past medical history of hypertension presents to the emergency department for low sodium and high potassium level.  According to the patient and wife, patient saw a podiatrist last week for ingrown toenails, during the visit was noted to have high blood pressure and was referred to her primary care doctor.  Saw the primary care doctor 1 week ago and was started on lisinopril for blood pressure.  This is the only prescription medication the patient takes, takes no over-the-counter supplements either.  Patient return to the doctor today to have labs checked and blood pressure rechecked.  Blood pressure had come down however the patient's sodium level was very low and his potassium level was high, they rechecked the blood work today and confirm the levels and sent the patient to the emergency department for evaluation.  Wife states the patient has been more fatigued recently, denies any cough or congestion vomiting or diarrhea.  Denies any changes in urination or decreased urination.   Past Medical History:  Diagnosis Date  . Renal disorder     There are no active problems to display for this patient.   History reviewed. No pertinent surgical history.  Prior to Admission medications   Not on File    Allergies  Allergen Reactions  . Penicillins Hives    No family history on file.  Social History Social History   Tobacco Use  . Smoking status: Former Games developermoker  . Smokeless tobacco: Never Used  Substance Use Topics  . Alcohol use: Yes    Alcohol/week: 3.0 standard drinks    Types: 3 Shots of liquor per week  . Drug use: Not on file    Review of Systems Constitutional: Negative for fever.  General fatigue.   Cardiovascular:  Negative for chest pain. Respiratory: Negative for shortness of breath. Gastrointestinal: Negative for abdominal pain, vomiting and diarrhea. Genitourinary: Negative for urinary compaints Musculoskeletal: Negative for musculoskeletal complaints Skin: Negative for skin complaints  Neurological: Negative for headache All other ROS negative  ____________________________________________   PHYSICAL EXAM:  VITAL SIGNS: ED Triage Vitals [07/25/18 1630]  Enc Vitals Group     BP 122/89     Pulse Rate (!) 108     Resp 18     Temp 98 F (36.7 C)     Temp Source Oral     SpO2 98 %     Weight 129 lb (58.5 kg)     Height 5\' 6"  (1.676 m)     Head Circumference      Peak Flow      Pain Score 0     Pain Loc      Pain Edu?      Excl. in GC?    Constitutional: Alert and oriented. Well appearing and in no distress.  Hard of hearing. Eyes: Normal exam ENT   Head: Normocephalic and atraumatic.   Mouth/Throat: Mucous membranes are moist. Cardiovascular: Normal rate, regular rhythm.  Respiratory: Normal respiratory effort without tachypnea nor retractions. Breath sounds are clear  Gastrointestinal: Soft and nontender. No distention.   Musculoskeletal: Nontender with normal range of motion in all extremities.  No lower extremity edema. Neurologic:  Normal speech and language. No gross focal neurologic deficits Skin:  Skin  is warm, dry and intact.  Psychiatric: Mood and affect are normal.   ____________________________________________    EKG  EKG reviewed and interpreted by myself shows normal sinus rhythm at 97 bpm with a narrow QRS, normal axis, normal intervals, no concerning ST changes.  ____________________________________________    RADIOLOGY  Chest x-ray shows an apical opacity in the right side.  ____________________________________________   INITIAL IMPRESSION / ASSESSMENT AND PLAN / ED COURSE  Pertinent labs & imaging results that were available during my care  of the patient were reviewed by me and considered in my medical decision making (see chart for details).  Patient presents to the emergency department referred from his primary care doctor for low sodium levels and elevated potassium levels.  We will recheck labs, IV hydrate and continue to closely monitor in the emergency department.  I reviewed the patient's outpatient lab work, if sodium level and potassium level are confirmed on her blood work I anticipate likely admission to the hospital.  Labs have resulted showing hyponatremia with a sodium of 125, usually in the mid 130s.  Potassium is 5.3, reported at 5.9 earlier today.  We will continue with IV hydration, no EKG changes noted.  Troponin is negative.  However given her lab abnormalities we will admit to the hospitalist service for continued treatment.  Chest x-ray shows a right apical opacity could be concerning for an oncologic process which could possibly lead to SIADH or this could be a result of the patient's new blood pressure medication.  We will obtain a CT of the chest with contrast to further evaluate.  CT scan shows several spiculated nodules.  Patient will be admitted to the hospital service for continued treatment.  ____________________________________________   FINAL CLINICAL IMPRESSION(S) / ED DIAGNOSES  Hyponatremia Hyperkalemia    Minna Antis, MD 07/25/18 2000

## 2018-07-25 NOTE — ED Triage Notes (Signed)
[facility] with [facility]/o hypotension, low sodium and low potassium. Bp in triage 122/89. Denies pain, appears in NAD.

## 2018-07-26 LAB — CBC
HEMATOCRIT: 35.7 % — AB (ref 39.0–52.0)
Hemoglobin: 12.6 g/dL — ABNORMAL LOW (ref 13.0–17.0)
MCH: 39 pg — ABNORMAL HIGH (ref 26.0–34.0)
MCHC: 35.3 g/dL (ref 30.0–36.0)
MCV: 110.5 fL — ABNORMAL HIGH (ref 80.0–100.0)
PLATELETS: 264 10*3/uL (ref 150–400)
RBC: 3.23 MIL/uL — ABNORMAL LOW (ref 4.22–5.81)
RDW: 12.5 % (ref 11.5–15.5)
WBC: 5.1 10*3/uL (ref 4.0–10.5)
nRBC: 0 % (ref 0.0–0.2)

## 2018-07-26 LAB — BASIC METABOLIC PANEL
Anion gap: 9 (ref 5–15)
BUN: 17 mg/dL (ref 8–23)
CALCIUM: 8.1 mg/dL — AB (ref 8.9–10.3)
CHLORIDE: 102 mmol/L (ref 98–111)
CO2: 19 mmol/L — ABNORMAL LOW (ref 22–32)
CREATININE: 1.3 mg/dL — AB (ref 0.61–1.24)
GFR calc Af Amer: >60 mL/min (ref >60)
GFR, EST NON AFRICAN AMERICAN: 55 mL/min — AB (ref >60)
Glucose, Bld: 100 mg/dL — ABNORMAL HIGH (ref 70–99)
Potassium: 4.4 mmol/L (ref 3.5–5.1)
SODIUM: 130 mmol/L — AB (ref 135–145)

## 2018-07-26 MED ORDER — AMLODIPINE BESYLATE 5 MG PO TABS: 5.0000 mg | ORAL_TABLET | Freq: Every day | ORAL | 0 refills | Status: DC

## 2018-07-26 NOTE — Discharge Instructions (Signed)
Heart healthy diet  It is very important you follow up with the lung doctor for the right lung nodule we found to make sure it is not worsening.

## 2018-07-26 NOTE — Care Management Obs Status (Signed)
MEDICARE OBSERVATION STATUS NOTIFICATION   Patient Details  Name: Nathan Sellers MRN: [id number] Date of Birth: [birth year]   Medicare Observation Status Notification Given:  No(Admitted obs less than 24 hours)    Chapman FitchBOWEN, Kenyah Luba T, RN 07/26/2018, 8:45 AM

## 2018-08-07 NOTE — Discharge Summary (Signed)
[facility] [place] [facility]   PATIENT NAME: Nathan Sellers    MR#:  [id number]  DATE OF BIRTH:  December 19, 1945  DATE OF ADMISSION:  07/25/2018 ADMITTING PHYSICIAN: Milagros Loll, MD  DATE OF DISCHARGE: 07/26/2018 10:54 AM  PRIMARY CARE PHYSICIAN: Wilford Corner, PA-C   ADMISSION DIAGNOSIS:  Hypokalemia [E87.6] Hyponatremia [E87.1] Lung nodule [R91.1]  DISCHARGE DIAGNOSIS:  Active Problems:   Hyponatremia   SECONDARY DIAGNOSIS:   Past Medical History:  Diagnosis Date  . Hypertension   . Renal disorder      ADMITTING HISTORY  HISTORY OF PRESENT ILLNESS:  Nathan Sellers  is a 72 y.o. male with a known history of hypertension who was recently started on lisinopril and hydrochlorothiazide 2 weeks back presents to the emergency room due to hyponatremia and hyperkalemia found on his BMP checked by primary care physician.  Patient was started on 2 tablets of lisinopril and hydrochlorothiazide which was cut in half due to dizziness and weakness and finally stopped.  He has not taken the medication in 1 week.  He does drink 2 drinks of whiskey every day.  Which is not new.  HOSPITAL COURSE:   *Hyperkalemia *Hypertension *Dehydration *CKD stage III  Patient was admitted to medical floor and his lisinopril hydrochlorothiazide combination was stopped.  Patient was started on IV fluids with which his potassium trended back to normal.  Creatinine improved.  Patient is being started on low-dose Norvasc for his blood pressure and follow-up with primary care physician within a week.  Patient discharged home in stable condition.   CONSULTS OBTAINED:    DRUG ALLERGIES:   Allergies  Allergen Reactions  . Penicillins Hives    DISCHARGE MEDICATIONS:   Allergies as of 07/26/2018      Reactions   Penicillins Hives      Medication List    TAKE these medications   amLODipine 5 MG tablet Commonly known as:  NORVASC Take 1 tablet (5 mg total) by mouth  daily.       Today   VITAL SIGNS:  Blood pressure (!) 156/91, pulse 84, temperature 98.9 F (37.2 C), temperature source Oral, resp. rate 20, height 5\' 6"  (1.676 m), weight 58.1 kg, SpO2 98 %.  I/O:  No intake or output data in the 24 hours ending 08/07/18 1428  PHYSICAL EXAMINATION:  Physical Exam  GENERAL:  72 y.o.-year-old patient lying in the bed with no acute distress.  LUNGS: Normal breath sounds bilaterally, no wheezing, rales,rhonchi or crepitation. No use of accessory muscles of respiration.  CARDIOVASCULAR: S1, S2 normal. No murmurs, rubs, or gallops.  ABDOMEN: Soft, non-tender, non-distended. Bowel sounds present. No organomegaly or mass.  NEUROLOGIC: Moves all 4 extremities. PSYCHIATRIC: The patient is alert and oriented x 3.  SKIN: No obvious rash, lesion, or ulcer.   DATA REVIEW:   CBC No results for input(s): WBC, HGB, HCT, PLT in the last 168 hours.  Chemistries  No results for input(s): NA, K, CL, CO2, GLUCOSE, BUN, CREATININE, CALCIUM, MG, AST, ALT, ALKPHOS, BILITOT in the last 168 hours.  Invalid input(s): GFRCGP  Cardiac Enzymes No results for input(s): TROPONINI in the last 168 hours.  Microbiology Results  No results found for this or any previous visit.  RADIOLOGY:  No results found.  Follow up with PCP in 1 week.  Management plans discussed with the patient, family and they are in agreement.  CODE STATUS:  Code Status History    Date Active Date Inactive Code Status  Order ID Comments User Context   07/25/2018 2019 07/26/2018 1359 DNR [id number]  Milagros LollSudini, Zyrah Wiswell, MD ED    Questions for Most Recent Historical Code Status (Order [id number])    Question Answer Comment   In the event of cardiac or respiratory ARREST Do not call a "code blue"    In the event of cardiac or respiratory ARREST Do not perform Intubation, CPR, defibrillation or ACLS    In the event of cardiac or respiratory ARREST Use medication by any route, position, wound care,  and other measures to relive pain and suffering. May use oxygen, suction and manual treatment of airway obstruction as needed for comfort.       TOTAL TIME TAKING CARE OF THIS PATIENT ON DAY OF DISCHARGE: more than 30 minutes.   Molinda BailiffSrikar R Ignazio Kincaid M.D on 08/07/2018 at 2:28 PM  Between 7am to 6pm - Pager - [phone]  After 6pm go to [email or web address] - password [email or web address] [facility]  [facility]  Office  [phone]  CC: Primary care physician; Whitaker, Jonnie FinnerJason Hestle, PA-C  Note: This dictation was prepared with Dragon dictation along with smaller phrase technology. Any transcriptional errors that result from this process are unintentional.

## 2018-09-19 ENCOUNTER — Other Ambulatory Visit (HOSPITAL_COMMUNITY): Payer: Self-pay | Admitting: Specialist

## 2018-09-19 ENCOUNTER — Other Ambulatory Visit: Payer: Self-pay | Admitting: Specialist

## 2018-09-19 DIAGNOSIS — R911 Solitary pulmonary nodule: Secondary | ICD-10-CM

## 2018-10-05 ENCOUNTER — Encounter
Admission: RE | Admit: 2018-10-05 | Discharge: 2018-10-05 | Disposition: A | Discharge status: Home / Self Care | Payer: Medicare HMO | Source: Ambulatory Visit | Attending: Specialist | Admitting: Specialist

## 2018-10-05 DIAGNOSIS — R911 Solitary pulmonary nodule: Secondary | ICD-10-CM | POA: Diagnosis present

## 2018-10-05 LAB — GLUCOSE, CAPILLARY: GLUCOSE-CAPILLARY: 90 mg/dL (ref 70–99)

## 2018-10-05 IMAGING — CT NM PET TUM IMG INITIAL (PI) SKULL BASE T - THIGH
1 of 10 series · 1 of 25 positions shown · non-contrast
Comparison: Chest CT 07/25/2018

CLINICAL DATA: Initial treatment strategy for pulmonary nodule.

EXAM:
NUCLEAR MEDICINE PET SKULL BASE TO THIGH
TECHNIQUE: 6.94 mCi F-18 FDG was injected intravenously. Full-ring PET imaging
was performed from the skull base to thigh after the radiotracer. CT
data was obtained and used for attenuation correction and anatomic
localization.
Fasting blood glucose: 90 mg/dl

[Series 3: ct wb 5.0 b30f · axial · 5.0mm · 0.98mm/px · 1 of 287 slices shown]
[im 287/287  brain]
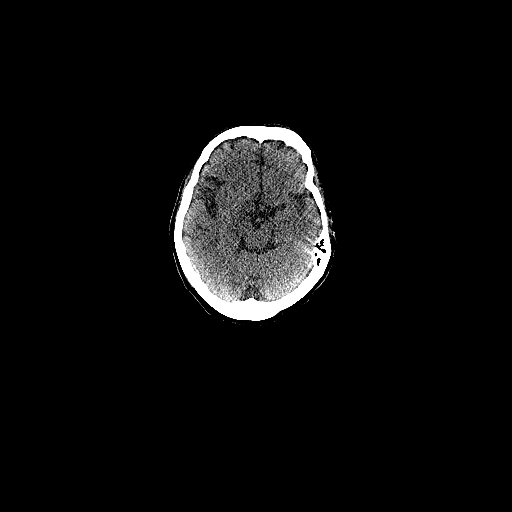

[1 of 25 positions shown; findings below may reference images not displayed]

FINDINGS: Mediastinal blood pool activity: SUV max

NECK: No hypermetabolic lymph nodes in the neck.

Incidental CT findings: none

CHEST: No hypermetabolic axillary or supraclavicular lymph nodes. No
hypermetabolic mediastinal or hilar lymph nodes identified.

Spiculated nodule within the right lower lobe is again noted
measuring 1.5 by 1.0 cm within SUV max of 2.8. The small nodule
within the posterior right upper lobe is unchanged measuring 5 mm.
This is too small to characterize by PET-CT.

Incidental CT findings: There is aortic atherosclerosis and 3 vessel
coronary artery atherosclerotic calcifications. Enlargement of the
main pulmonary artery is identified suggestive of PA hypertension.
The left main pulmonary artery is markedly dilated measuring 3.8 cm.
Trace left pleural effusion. Right upper lobe area of masslike
architectural distortion, fibrosis and bronchiectasis is again
identified, likely postinflammatory.

ABDOMEN/PELVIS: No abnormal hypermetabolic activity within the
liver, pancreas, adrenal glands, or spleen. No hypermetabolic lymph
nodes in the abdomen or pelvis.

Incidental CT findings: Gallstones identified. Extensive aortic
atherosclerosis is identified. Status post aortic bifemoral bypass
grafting. Ventral abdominal wall hernia is noted containing fat
only, image 176/3.

SKELETON: No focal hypermetabolic activity to suggest skeletal
metastasis.

Incidental CT findings: none
IMPRESSION: 1. There is increased radiotracer uptake associated with the 1.5 cm
peripheral right lower lobe spiculated nodule worrisome for small
primary bronchogenic carcinoma. No hypermetabolic lymph nodes or
evidence of distant metastatic disease.
2. Additional incidental findings include: Aortic atherosclerosis
and 3 vessel coronary artery calcifications. Enlargement of the
pulmonary artery suggestive of PA hypertension. Trace left pleural
effusion. Chronic postinflammatory fibrosis, masslike architectural
distortion and bronchiectasis. Gallstones. Previous aortic bifemoral
bypass grafting.

## 2018-10-05 MED ORDER — FLUDEOXYGLUCOSE F - 18 (FDG) INJECTION
6.9400 | Freq: Once | INTRAVENOUS | Status: AC | PRN
  Administered 2018-10-05: 6.94 via INTRAVENOUS

## 2019-03-10 ENCOUNTER — Emergency Department (HOSPITAL_COMMUNITY): Payer: Medicare HMO

## 2019-03-10 ENCOUNTER — Other Ambulatory Visit: Payer: Self-pay

## 2019-03-10 ENCOUNTER — Inpatient Hospital Stay (HOSPITAL_COMMUNITY)
Admission: EM | Admit: 2019-03-10 | Discharge: 2019-03-13 | DRG: 470 | Disposition: A | Discharge status: Home Health | Payer: Medicare HMO | Attending: Internal Medicine | Admitting: Internal Medicine

## 2019-03-10 ENCOUNTER — Encounter (HOSPITAL_COMMUNITY): Payer: Self-pay | Admitting: Emergency Medicine

## 2019-03-10 DIAGNOSIS — I739 Peripheral vascular disease, unspecified: Secondary | ICD-10-CM | POA: Diagnosis present

## 2019-03-10 DIAGNOSIS — E871 Hypo-osmolality and hyponatremia: Secondary | ICD-10-CM | POA: Diagnosis present

## 2019-03-10 DIAGNOSIS — Z9114 Patient's other noncompliance with medication regimen: Secondary | ICD-10-CM

## 2019-03-10 DIAGNOSIS — S72012A Unspecified intracapsular fracture of left femur, initial encounter for closed fracture: Principal | ICD-10-CM | POA: Diagnosis present

## 2019-03-10 DIAGNOSIS — E860 Dehydration: Secondary | ICD-10-CM | POA: Diagnosis present

## 2019-03-10 DIAGNOSIS — Z66 Do not resuscitate: Secondary | ICD-10-CM | POA: Diagnosis present

## 2019-03-10 DIAGNOSIS — I2510 Atherosclerotic heart disease of native coronary artery without angina pectoris: Secondary | ICD-10-CM | POA: Diagnosis present

## 2019-03-10 DIAGNOSIS — N179 Acute kidney failure, unspecified: Secondary | ICD-10-CM | POA: Diagnosis present

## 2019-03-10 DIAGNOSIS — Z1159 Encounter for screening for other viral diseases: Secondary | ICD-10-CM

## 2019-03-10 DIAGNOSIS — D62 Acute posthemorrhagic anemia: Secondary | ICD-10-CM | POA: Diagnosis not present

## 2019-03-10 DIAGNOSIS — S72009A Fracture of unspecified part of neck of unspecified femur, initial encounter for closed fracture: Secondary | ICD-10-CM

## 2019-03-10 DIAGNOSIS — F1020 Alcohol dependence, uncomplicated: Secondary | ICD-10-CM | POA: Diagnosis present

## 2019-03-10 DIAGNOSIS — R296 Repeated falls: Secondary | ICD-10-CM | POA: Diagnosis present

## 2019-03-10 DIAGNOSIS — I129 Hypertensive chronic kidney disease with stage 1 through stage 4 chronic kidney disease, or unspecified chronic kidney disease: Secondary | ICD-10-CM | POA: Diagnosis present

## 2019-03-10 DIAGNOSIS — J449 Chronic obstructive pulmonary disease, unspecified: Secondary | ICD-10-CM | POA: Diagnosis present

## 2019-03-10 DIAGNOSIS — N183 Chronic kidney disease, stage 3 unspecified: Secondary | ICD-10-CM | POA: Diagnosis present

## 2019-03-10 DIAGNOSIS — E785 Hyperlipidemia, unspecified: Secondary | ICD-10-CM | POA: Diagnosis present

## 2019-03-10 DIAGNOSIS — Y92009 Unspecified place in unspecified non-institutional (private) residence as the place of occurrence of the external cause: Secondary | ICD-10-CM

## 2019-03-10 DIAGNOSIS — R911 Solitary pulmonary nodule: Secondary | ICD-10-CM | POA: Diagnosis present

## 2019-03-10 DIAGNOSIS — H9190 Unspecified hearing loss, unspecified ear: Secondary | ICD-10-CM | POA: Diagnosis present

## 2019-03-10 DIAGNOSIS — S72002A Fracture of unspecified part of neck of left femur, initial encounter for closed fracture: Secondary | ICD-10-CM

## 2019-03-10 DIAGNOSIS — I10 Essential (primary) hypertension: Secondary | ICD-10-CM | POA: Diagnosis present

## 2019-03-10 DIAGNOSIS — Z96642 Presence of left artificial hip joint: Secondary | ICD-10-CM

## 2019-03-10 DIAGNOSIS — E874 Mixed disorder of acid-base balance: Secondary | ICD-10-CM | POA: Diagnosis present

## 2019-03-10 DIAGNOSIS — W1830XA Fall on same level, unspecified, initial encounter: Secondary | ICD-10-CM | POA: Diagnosis present

## 2019-03-10 DIAGNOSIS — Z87891 Personal history of nicotine dependence: Secondary | ICD-10-CM | POA: Diagnosis not present

## 2019-03-10 HISTORY — DX: Chronic obstructive pulmonary disease, unspecified: J44.9

## 2019-03-10 LAB — PROTIME-INR
INR: 1.1 (ref 0.8–1.2)
Prothrombin Time: 13.8 seconds (ref 11.4–15.2)

## 2019-03-10 LAB — COMPREHENSIVE METABOLIC PANEL
ALT: 18 U/L (ref 0–44)
AST: 28 U/L (ref 15–41)
Albumin: 3.4 g/dL — ABNORMAL LOW (ref 3.5–5.0)
Alkaline Phosphatase: 77 U/L (ref 38–126)
Anion gap: 16 — ABNORMAL HIGH (ref 5–15)
BUN: 31 mg/dL — ABNORMAL HIGH (ref 8–23)
CO2: 16 mmol/L — ABNORMAL LOW (ref 22–32)
Calcium: 8.2 mg/dL — ABNORMAL LOW (ref 8.9–10.3)
Chloride: 89 mmol/L — ABNORMAL LOW (ref 98–111)
Creatinine, Ser: 2.04 mg/dL — ABNORMAL HIGH (ref 0.61–1.24)
GFR calc Af Amer: 37 mL/min — ABNORMAL LOW (ref >60)
GFR calc non Af Amer: 32 mL/min — ABNORMAL LOW (ref >60)
Glucose, Bld: 107 mg/dL — ABNORMAL HIGH (ref 70–99)
Potassium: 4.5 mmol/L (ref 3.5–5.1)
Sodium: 121 mmol/L — ABNORMAL LOW (ref 135–145)
Total Bilirubin: 1.7 mg/dL — ABNORMAL HIGH (ref 0.3–1.2)
Total Protein: 6.8 g/dL (ref 6.5–8.1)

## 2019-03-10 LAB — TSH: TSH: 1.518 u[IU]/mL (ref 0.350–4.500)

## 2019-03-10 LAB — URINALYSIS, ROUTINE W REFLEX MICROSCOPIC
Bacteria, UA: NONE SEEN
Bilirubin Urine: NEGATIVE
Glucose, UA: NEGATIVE mg/dL
Ketones, ur: 5 mg/dL — AB
Leukocytes,Ua: NEGATIVE
Nitrite: NEGATIVE
Protein, ur: NEGATIVE mg/dL
Specific Gravity, Urine: 1.012 (ref 1.005–1.030)
pH: 5 (ref 5.0–8.0)

## 2019-03-10 LAB — CBC WITH DIFFERENTIAL/PLATELET
Abs Immature Granulocytes: 0.1 10*3/uL — ABNORMAL HIGH (ref 0.00–0.07)
Basophils Absolute: 0 10*3/uL (ref 0.0–0.1)
Basophils Relative: 0 %
Eosinophils Absolute: 0 10*3/uL (ref 0.0–0.5)
Eosinophils Relative: 0 %
HCT: 27.5 % — ABNORMAL LOW (ref 39.0–52.0)
Hemoglobin: 9.7 g/dL — ABNORMAL LOW (ref 13.0–17.0)
Immature Granulocytes: 1 %
Lymphocytes Relative: 4 %
Lymphs Abs: 0.3 10*3/uL — ABNORMAL LOW (ref 0.7–4.0)
MCH: 37.5 pg — ABNORMAL HIGH (ref 26.0–34.0)
MCHC: 35.3 g/dL (ref 30.0–36.0)
MCV: 106.2 fL — ABNORMAL HIGH (ref 80.0–100.0)
Monocytes Absolute: 0.2 10*3/uL (ref 0.1–1.0)
Monocytes Relative: 3 %
Neutro Abs: 7.9 10*3/uL — ABNORMAL HIGH (ref 1.7–7.7)
Neutrophils Relative %: 92 %
Platelets: 193 10*3/uL (ref 150–400)
RBC: 2.59 MIL/uL — ABNORMAL LOW (ref 4.22–5.81)
RDW: 13.6 % (ref 11.5–15.5)
WBC: 8.5 10*3/uL (ref 4.0–10.5)
nRBC: 0 % (ref 0.0–0.2)

## 2019-03-10 LAB — BASIC METABOLIC PANEL
Anion gap: 13 (ref 5–15)
Anion gap: 14 (ref 5–15)
BUN: 33 mg/dL — ABNORMAL HIGH (ref 8–23)
BUN: 31 mg/dL — ABNORMAL HIGH (ref 8–23)
CO2: 16 mmol/L — ABNORMAL LOW (ref 22–32)
CO2: 14 mmol/L — ABNORMAL LOW (ref 22–32)
Calcium: 7.7 mg/dL — ABNORMAL LOW (ref 8.9–10.3)
Calcium: 7.8 mg/dL — ABNORMAL LOW (ref 8.9–10.3)
Chloride: 93 mmol/L — ABNORMAL LOW (ref 98–111)
Chloride: 94 mmol/L — ABNORMAL LOW (ref 98–111)
Creatinine, Ser: 1.8 mg/dL — ABNORMAL HIGH (ref 0.61–1.24)
Creatinine, Ser: 1.92 mg/dL — ABNORMAL HIGH (ref 0.61–1.24)
GFR calc Af Amer: 43 mL/min — ABNORMAL LOW (ref >60)
GFR calc Af Amer: 39 mL/min — ABNORMAL LOW (ref >60)
GFR calc non Af Amer: 37 mL/min — ABNORMAL LOW (ref >60)
GFR calc non Af Amer: 34 mL/min — ABNORMAL LOW (ref >60)
Glucose, Bld: 90 mg/dL (ref 70–99)
Glucose, Bld: 81 mg/dL (ref 70–99)
Potassium: 4.3 mmol/L (ref 3.5–5.1)
Potassium: 4 mmol/L (ref 3.5–5.1)
Sodium: 122 mmol/L — ABNORMAL LOW (ref 135–145)
Sodium: 122 mmol/L — ABNORMAL LOW (ref 135–145)

## 2019-03-10 LAB — BLOOD GAS, ARTERIAL
Acid-base deficit: 8.1 mmol/L — ABNORMAL HIGH (ref 0.0–2.0)
Bicarbonate: 18.3 mmol/L — ABNORMAL LOW (ref 20.0–28.0)
FIO2: 28
O2 Saturation: 84.3 %
Patient temperature: 37
pCO2 arterial: 20.7 mmHg — ABNORMAL LOW (ref 32.0–48.0)
pH, Arterial: 7.472 — ABNORMAL HIGH (ref 7.350–7.450)
pO2, Arterial: 52.3 mmHg — ABNORMAL LOW (ref 83.0–108.0)

## 2019-03-10 LAB — MAGNESIUM: Magnesium: 1.5 mg/dL — ABNORMAL LOW (ref 1.7–2.4)

## 2019-03-10 LAB — ACETAMINOPHEN LEVEL: Acetaminophen (Tylenol), Serum: <10 ug/mL — ABNORMAL LOW (ref 10–30)

## 2019-03-10 LAB — TROPONIN I (HIGH SENSITIVITY)
Troponin I (High Sensitivity): 14 ng/L (ref <18)
Troponin I (High Sensitivity): 14 ng/L (ref <18)

## 2019-03-10 LAB — RAPID URINE DRUG SCREEN, HOSP PERFORMED
Amphetamines: NOT DETECTED
Barbiturates: NOT DETECTED
Benzodiazepines: NOT DETECTED
Cocaine: NOT DETECTED
Opiates: NOT DETECTED
Tetrahydrocannabinol: NOT DETECTED

## 2019-03-10 LAB — ETHANOL: Alcohol, Ethyl (B): <10 mg/dL (ref <10)

## 2019-03-10 LAB — LACTIC ACID, PLASMA
Lactic Acid, Venous: 2.3 mmol/L (ref 0.5–1.9)
Lactic Acid, Venous: 1.6 mmol/L (ref 0.5–1.9)

## 2019-03-10 LAB — AMMONIA: Ammonia: 18 umol/L (ref 9–35)

## 2019-03-10 LAB — SODIUM, URINE, RANDOM: Sodium, Ur: 31 mmol/L

## 2019-03-10 LAB — SALICYLATE LEVEL: Salicylate Lvl: <7 mg/dL (ref 2.8–30.0)

## 2019-03-10 LAB — SARS CORONAVIRUS 2 BY RT PCR (HOSPITAL ORDER, PERFORMED IN ~~LOC~~ HOSPITAL LAB): SARS Coronavirus 2: NEGATIVE

## 2019-03-10 LAB — OSMOLALITY: Osmolality: 258 mOsm/kg — ABNORMAL LOW (ref 275–295)

## 2019-03-10 LAB — PHOSPHORUS: Phosphorus: 3.4 mg/dL (ref 2.5–4.6)

## 2019-03-10 LAB — LIPASE, BLOOD: Lipase: 33 U/L (ref 11–51)

## 2019-03-10 LAB — OSMOLALITY, URINE: Osmolality, Ur: 377 mOsm/kg (ref 300–900)

## 2019-03-10 IMAGING — CT CT HEAD WITHOUT CONTRAST
3 series · 16 of 47 positions shown, 19 images · non-contrast
Comparison: None.

CLINICAL DATA: Recent fall. Minor blunt head trauma. Initial
encounter.

EXAM:
CT HEAD WITHOUT CONTRAST
TECHNIQUE: Contiguous axial images were obtained from the base of the skull
through the vertex without intravenous contrast.

[Series 2: head w o · axial · 0.46mm/px · z∈[+437,+577]mm · 10 of 34 slices shown, 13 images]
[im 3/34  brain]
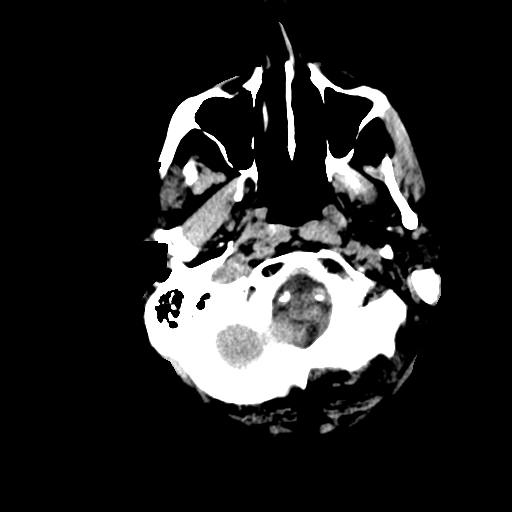
[im 3/34  bone]
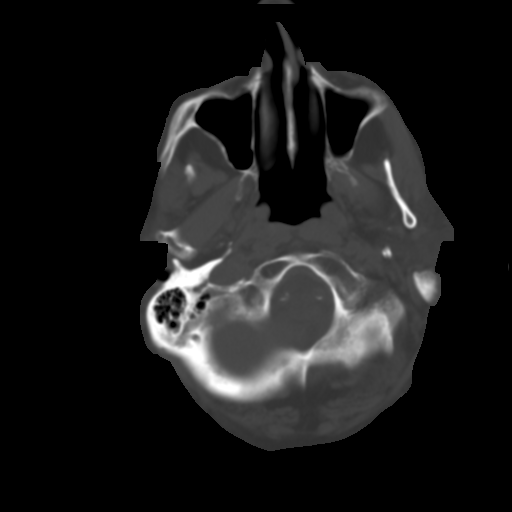
[im 6/34  brain]
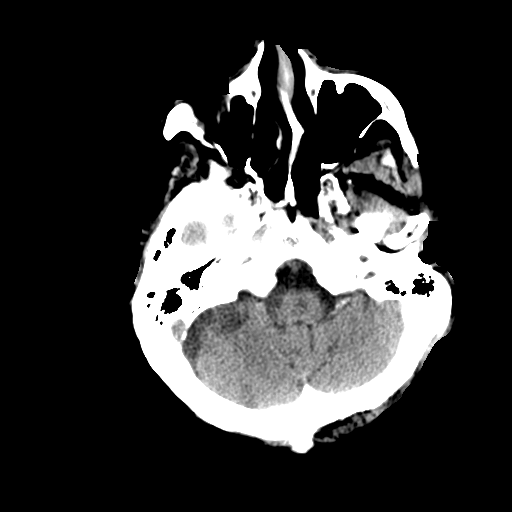
[im 10/34  brain]
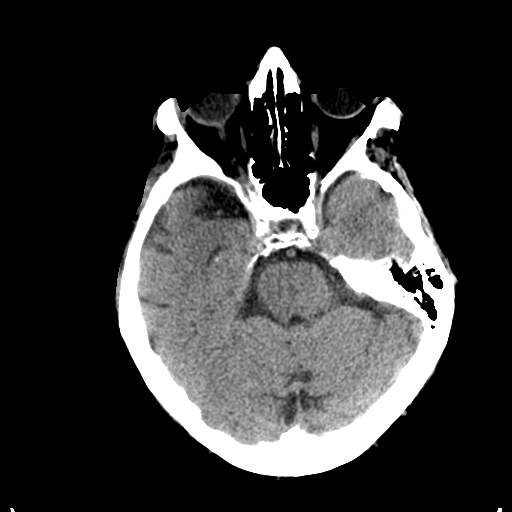
[im 12/34  brain]
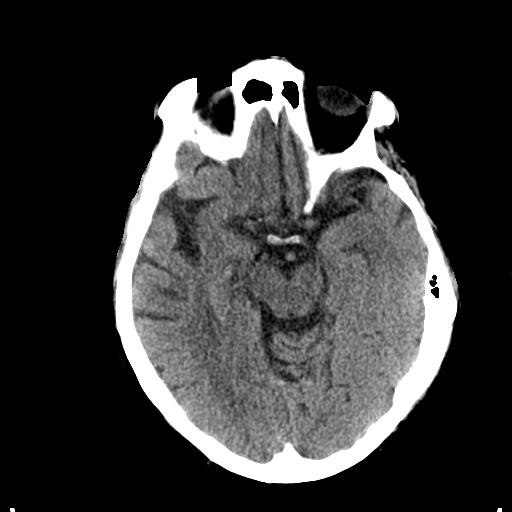
[im 15/34  brain]
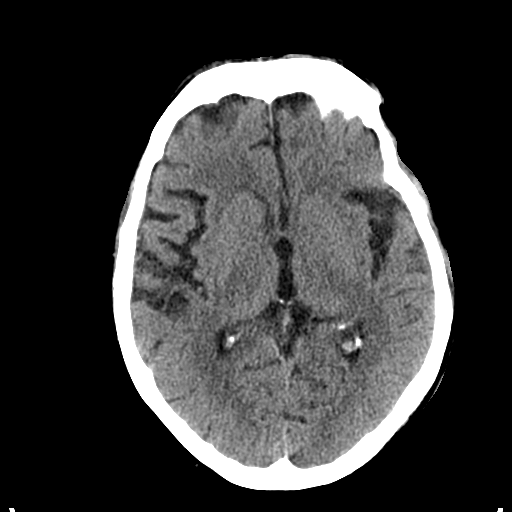
[im 15/34  bone]
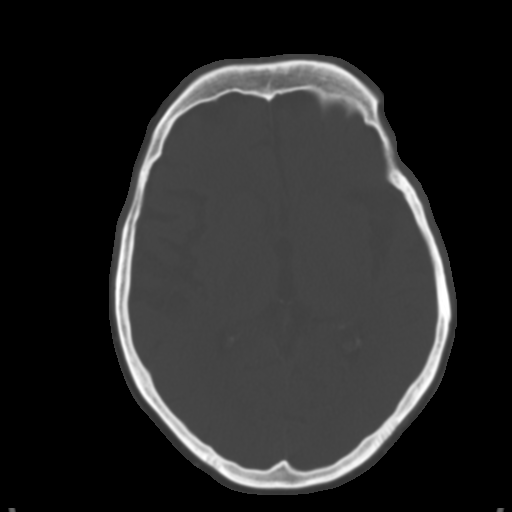
[im 19/34  brain]
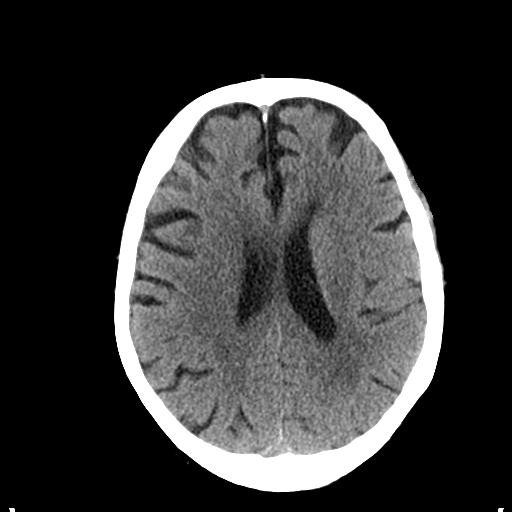
[im 22/34  brain]
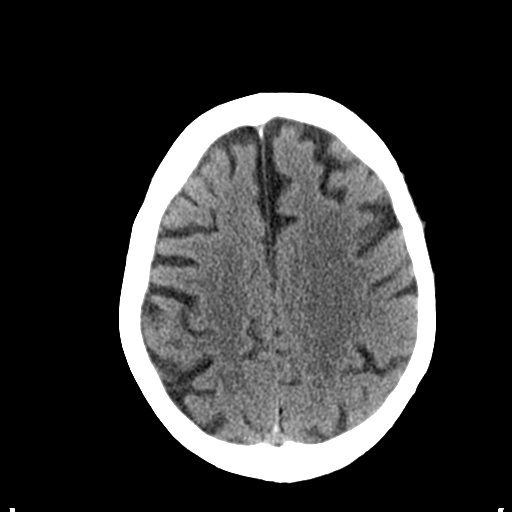
[im 26/34  brain]
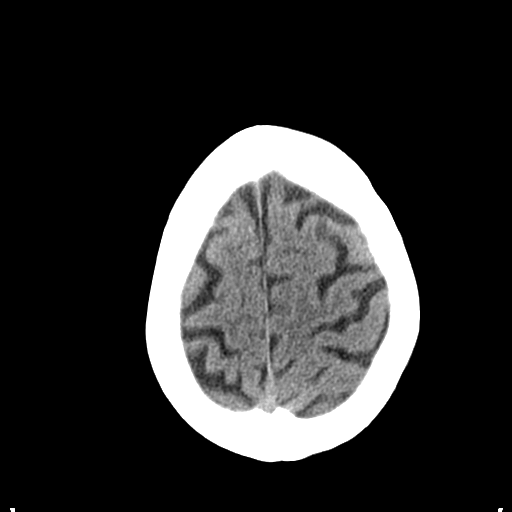
[im 28/34  brain]
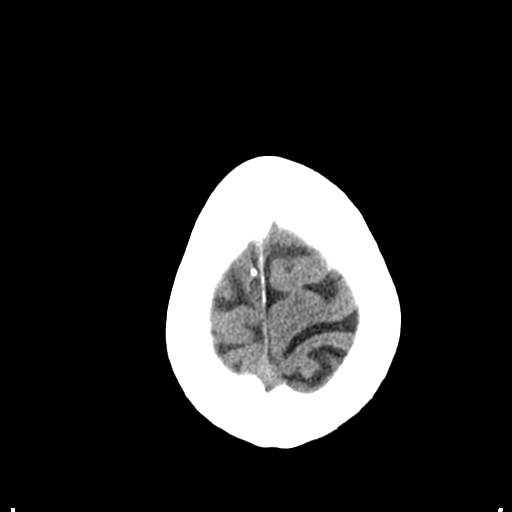
[im 28/34  bone]
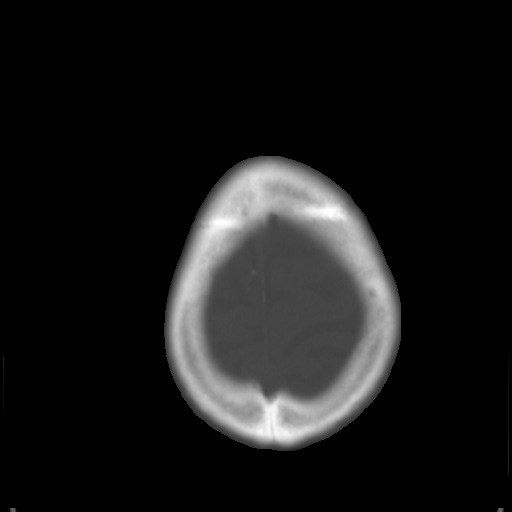
[im 31/34  brain]
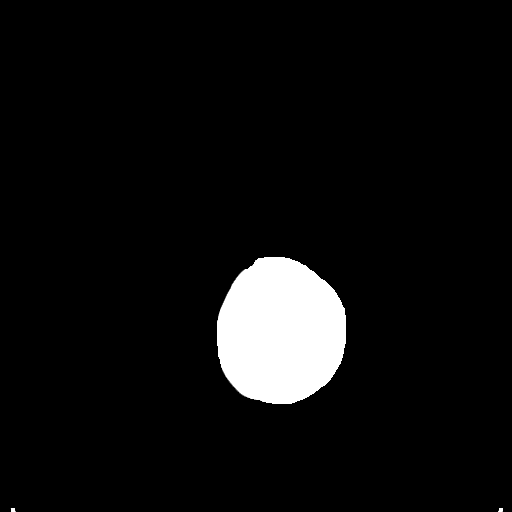

[Series 4: coronal soft · coronal · 0.37mm/px · 3 of 82 slices shown]
[im 28/82  brain]
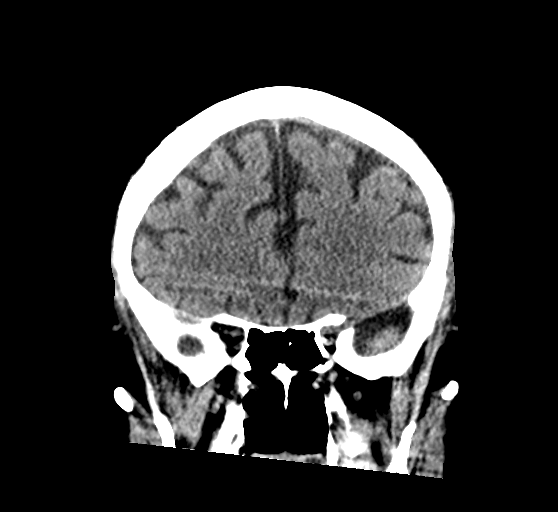
[im 37/82  brain]
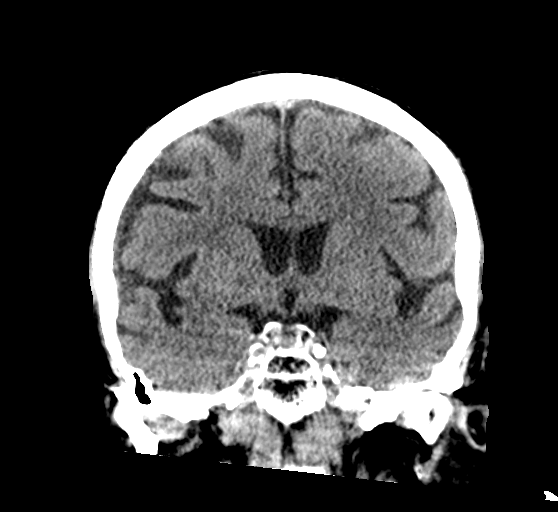
[im 46/82  brain]
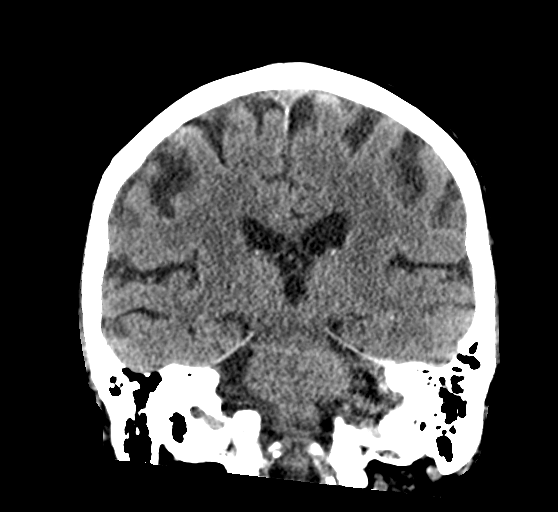

[Series 5: sagittal soft · sagittal · 0.33mm/px · 3 of 63 slices shown]
[im 21/63  brain]
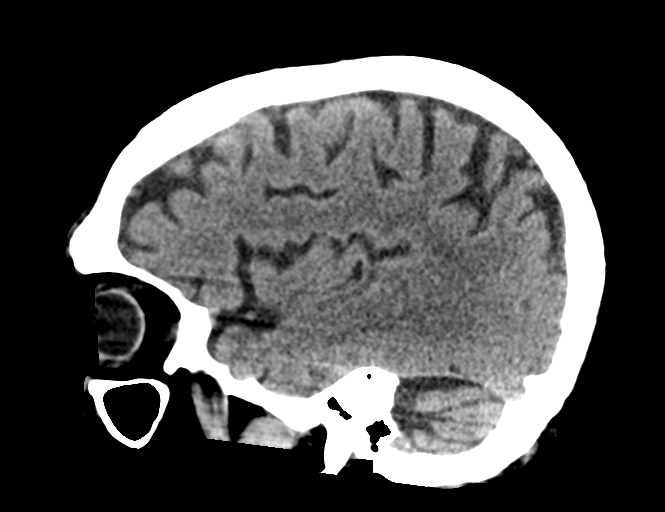
[im 32/63  brain]
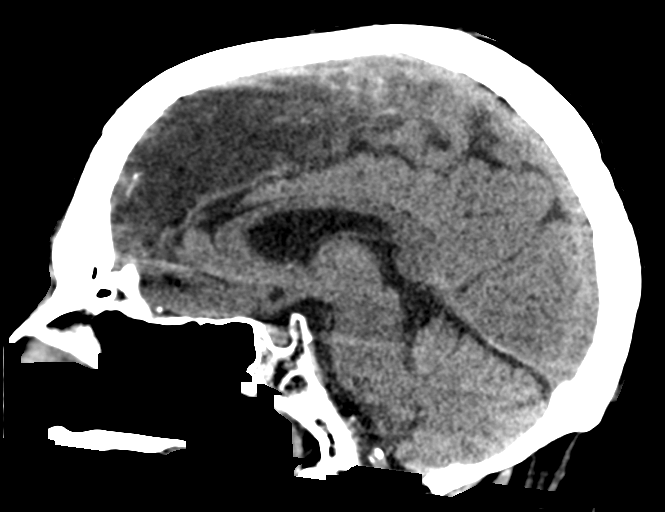
[im 42/63  brain]
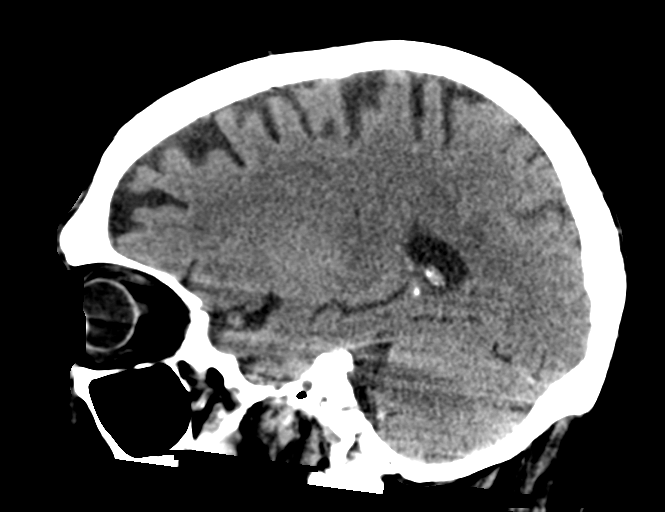

[16 of 47 positions shown; findings below may reference images not displayed]

FINDINGS: Brain: No evidence of acute infarction, hemorrhage, hydrocephalus,
extra-axial collection, or mass lesion/mass effect. Mild diffuse
cerebral atrophy and chronic small vessel disease.

Vascular:  No hyperdense vessel or other acute findings.

Skull: No evidence of fracture or other significant bone
abnormality.

Sinuses/Orbits:  No acute findings.

Other: None.
IMPRESSION: No acute intracranial abnormality.

Mild cerebral atrophy and chronic small vessel disease.

## 2019-03-10 IMAGING — DX PELVIS - 1-2 VIEW
1 series · 1 of 1 positions shown · non-contrast
Comparison: None.

CLINICAL DATA: Fall yesterday. Pelvis and left hip pain.

EXAM:
PELVIS - 1-2 VIEW

[pelvis ap]
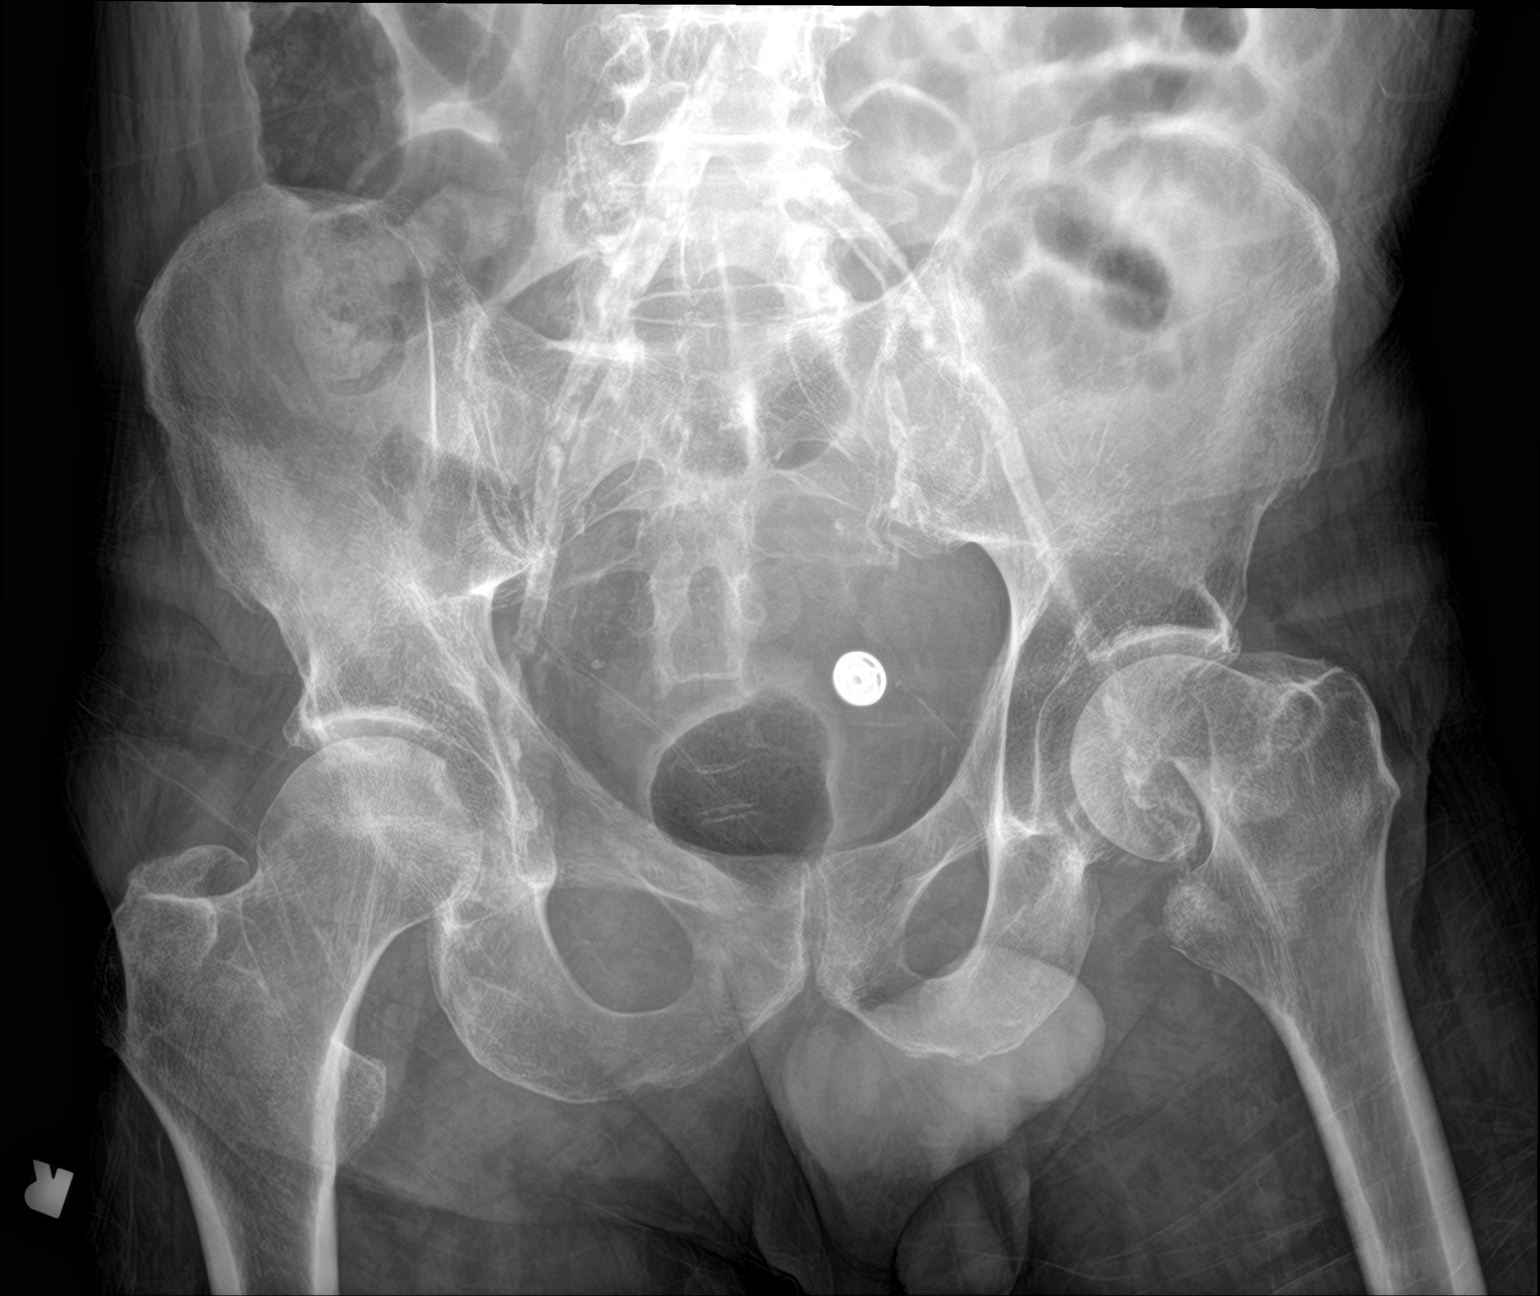

[1 of 1 positions shown; findings below may reference images not displayed]

FINDINGS: A mildly displaced left femoral neck fracture is seen. No evidence
of hip dislocation. No evidence of pelvic fracture. Extensive
atherosclerotic calcification involving the iliac arteries.
IMPRESSION: Mildly displaced left femoral neck fracture.

## 2019-03-10 IMAGING — DX LEFT FOREARM - 2 VIEW
2 series · 2 of 2 positions shown · non-contrast
Comparison: None.

CLINICAL DATA: Fall yesterday. Left forearm pain and bruising.
Initial encounter.

EXAM:
LEFT FOREARM - 2 VIEW

[forearm ap]
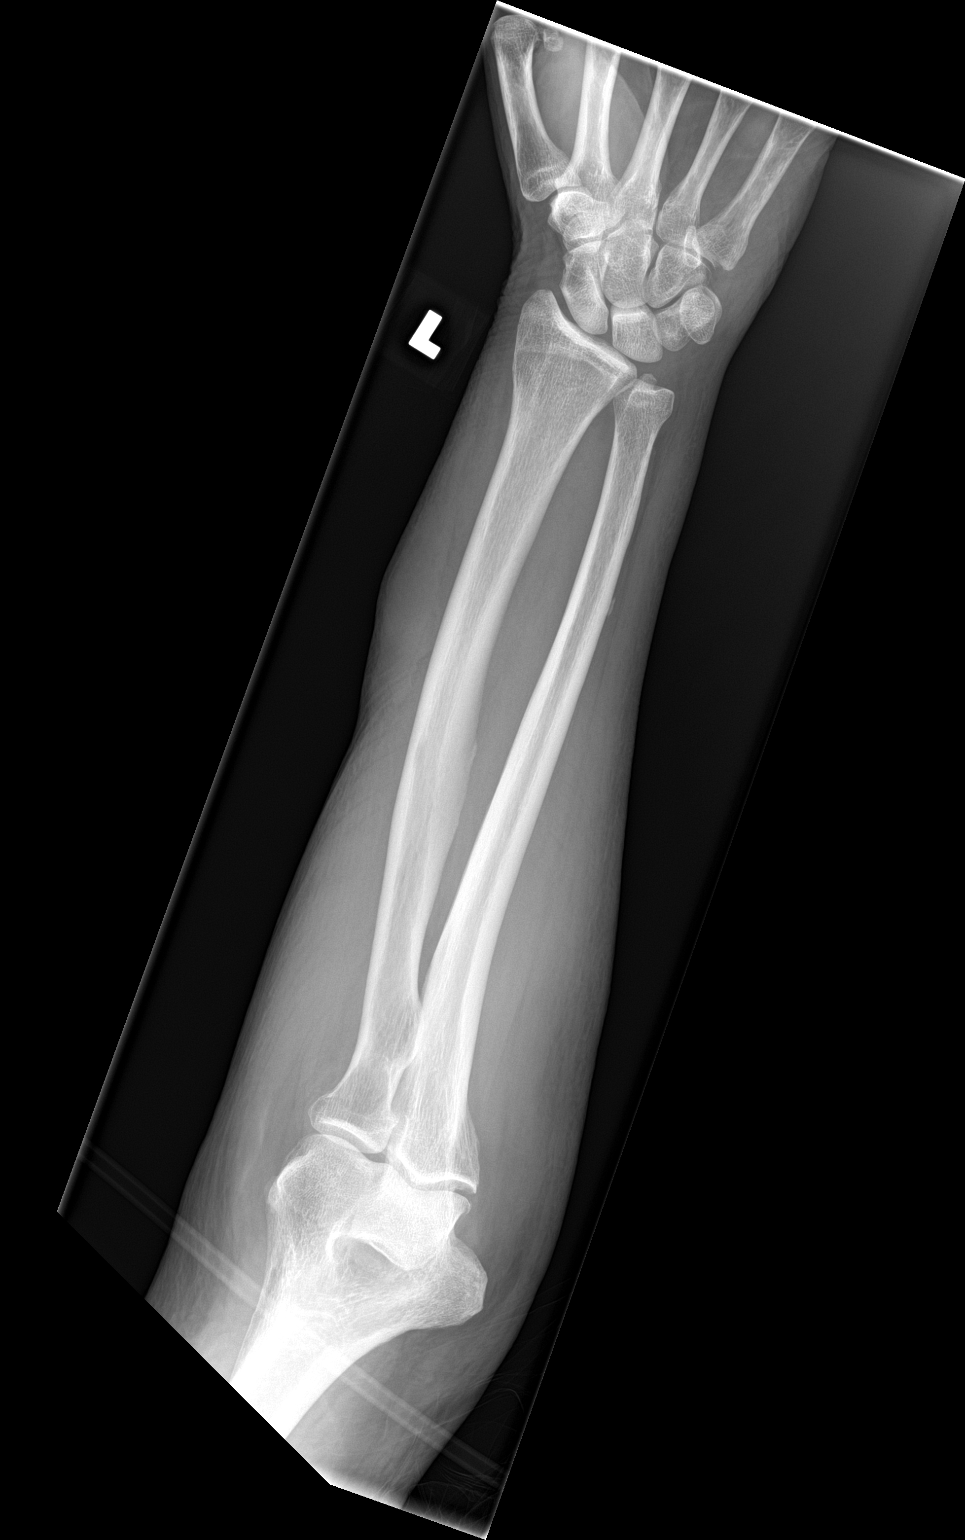

[forearm lat]
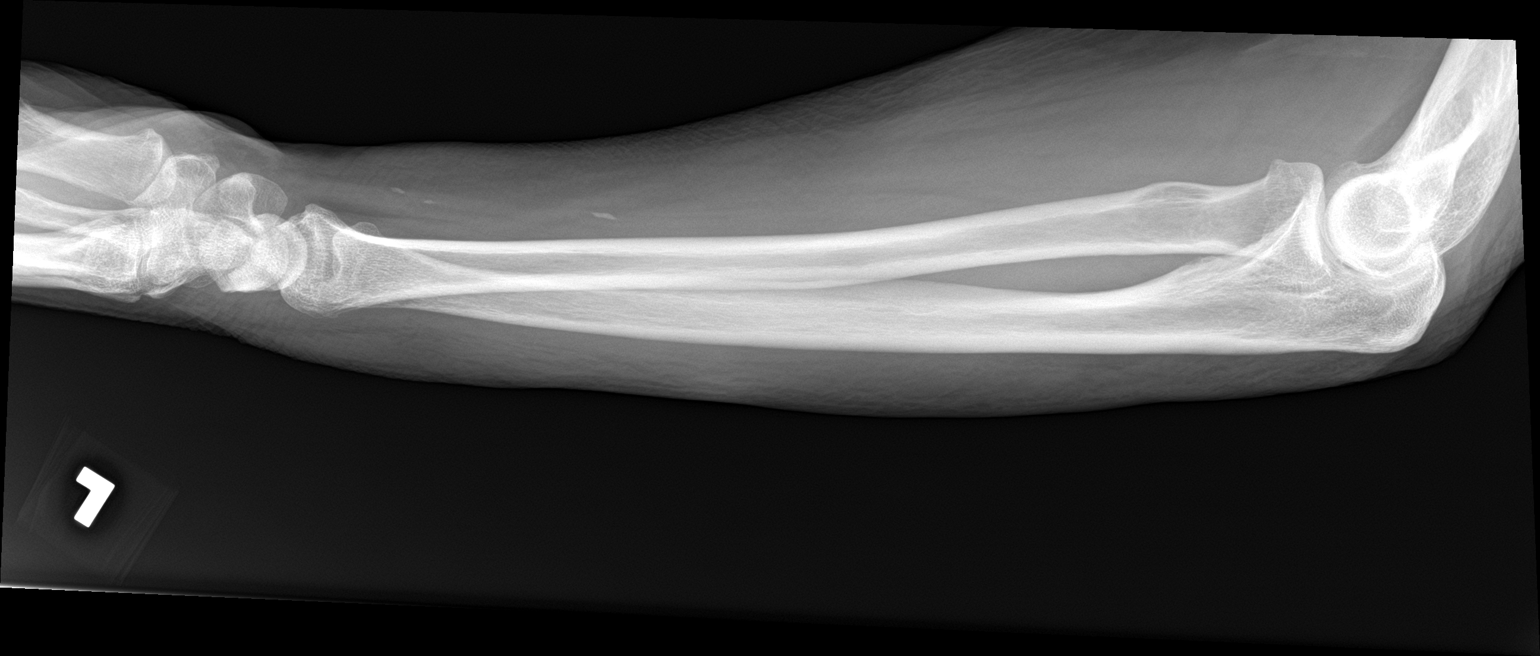

[2 of 2 positions shown; findings below may reference images not displayed]

FINDINGS: There is no evidence of fracture or other focal bone lesions.
Moderate soft tissue swelling is seen involving the distal forearm.
Mild peripheral vascular calcification also seen.
IMPRESSION: Distal forearm soft tissue swelling. No evidence of fracture.

## 2019-03-10 IMAGING — DX PORTABLE CHEST - 1 VIEW
1 series · 1 of 1 positions shown · non-contrast
Comparison: 07/25/2018

CLINICAL DATA: Shortness of breath. Dyspnea. COPD. Recent falls.

EXAM:
PORTABLE CHEST 1 VIEW

[chest ap]
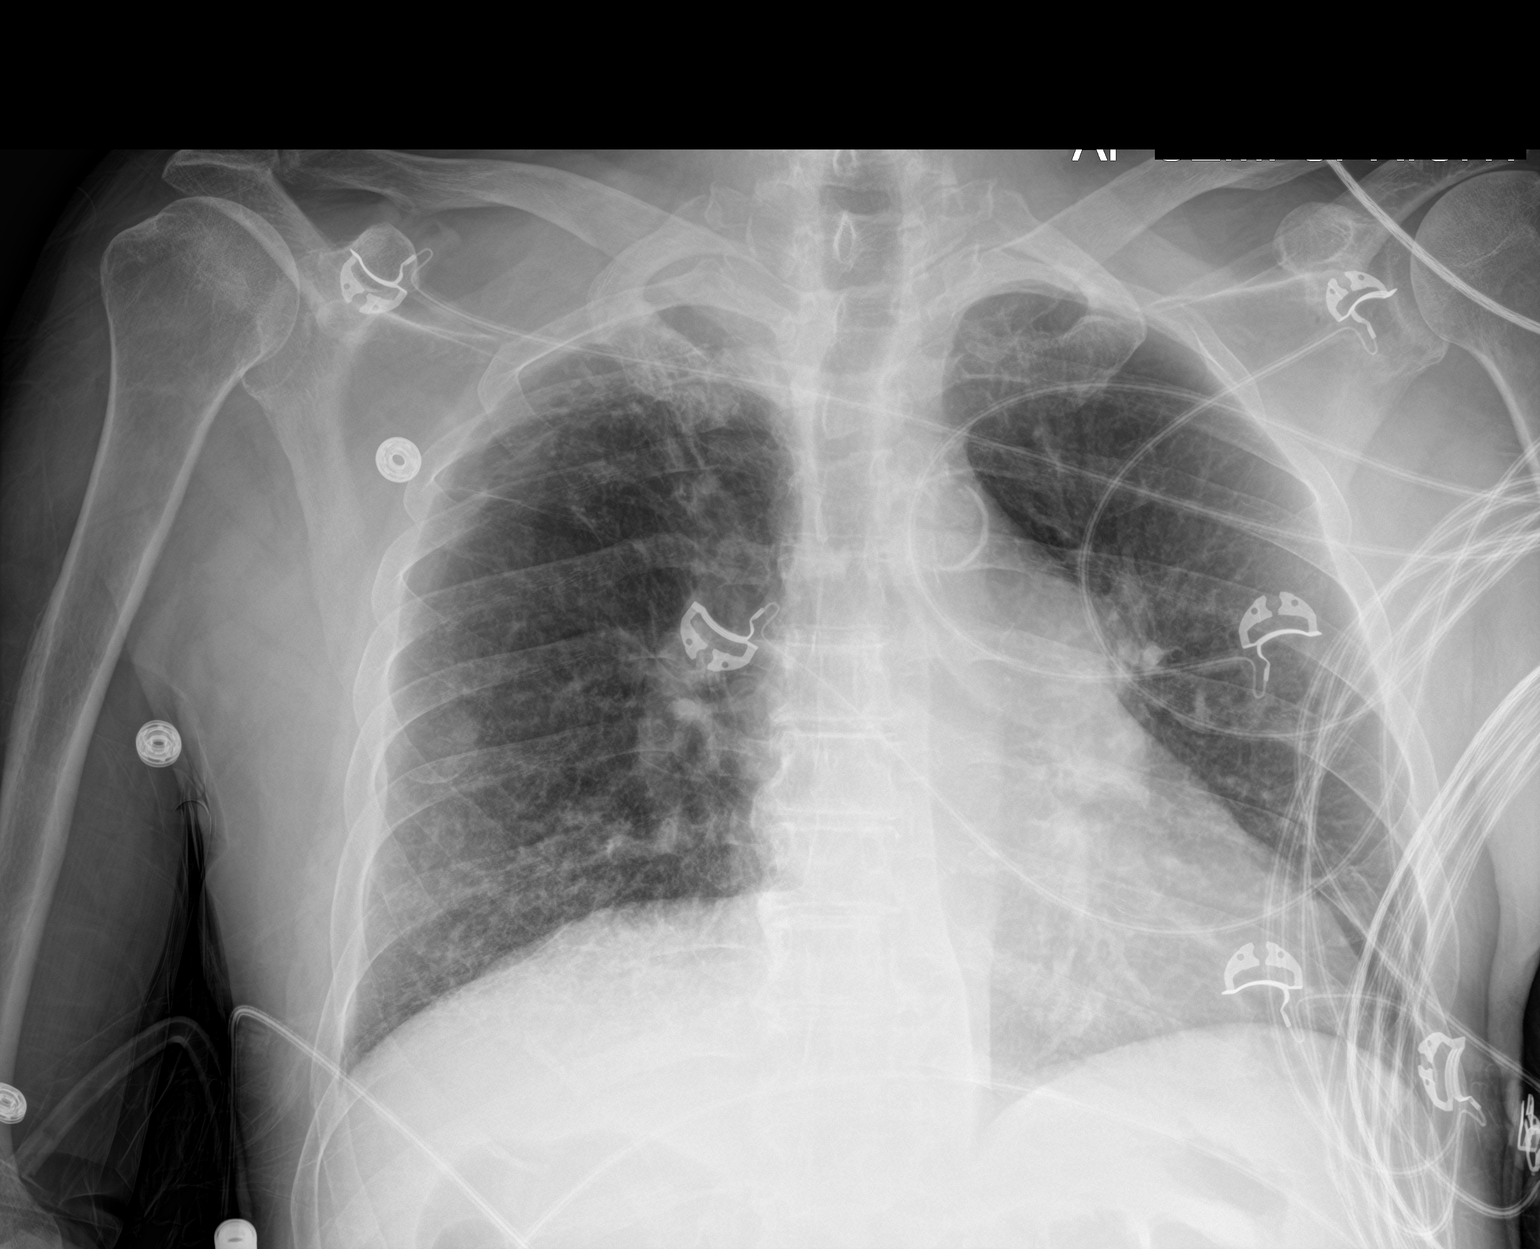

[1 of 1 positions shown; findings below may reference images not displayed]

FINDINGS: The heart size and mediastinal contours are within normal limits.
Aortic atherosclerosis. No evidence of acute pulmonary infiltrate or
edema. No evidence of pleural effusion. A small nodular density is
seen in right lower lobe, as seen on recent PET-CT. The visualized
skeletal structures are unremarkable.
IMPRESSION: 1. No acute findings.
2. Right lower lobe pulmonary nodule, as seen on recent PET-CT.

## 2019-03-10 MED ORDER — MORPHINE SULFATE (PF) 2 MG/ML IV SOLN
2.0000 mg | INTRAVENOUS | Status: DC | PRN
  Administered 2019-03-10: 2 mg via INTRAVENOUS
  Filled 2019-03-10 (×2): qty 1

## 2019-03-10 MED ORDER — ONDANSETRON HCL 4 MG/2ML IJ SOLN: 4.0000 mg | Freq: Four times a day (QID) | INTRAMUSCULAR | Status: DC | PRN

## 2019-03-10 MED ORDER — ATORVASTATIN CALCIUM 40 MG PO TABS
40.0000 mg | ORAL_TABLET | Freq: Every day | ORAL | Status: DC
  Administered 2019-03-10 – 2019-03-13 (×4): 40 mg via ORAL
  Filled 2019-03-10 (×4): qty 1

## 2019-03-10 MED ORDER — ADULT MULTIVITAMIN W/MINERALS CH
1.0000 | ORAL_TABLET | Freq: Every day | ORAL | Status: DC
  Administered 2019-03-10 – 2019-03-13 (×4): 1 via ORAL
  Filled 2019-03-10 (×4): qty 1

## 2019-03-10 MED ORDER — LORAZEPAM 1 MG PO TABS: 1.0000 mg | ORAL_TABLET | Freq: Four times a day (QID) | ORAL | Status: AC | PRN

## 2019-03-10 MED ORDER — POLYETHYLENE GLYCOL 3350 17 G PO PACK: 17.0000 g | PACK | Freq: Every day | ORAL | Status: DC | PRN

## 2019-03-10 MED ORDER — FOLIC ACID 1 MG PO TABS
1.0000 mg | ORAL_TABLET | Freq: Every day | ORAL | Status: DC
  Administered 2019-03-10 – 2019-03-13 (×4): 1 mg via ORAL
  Filled 2019-03-10 (×4): qty 1

## 2019-03-10 MED ORDER — LORAZEPAM 2 MG/ML IJ SOLN: 1.0000 mg | Freq: Four times a day (QID) | INTRAMUSCULAR | Status: AC | PRN

## 2019-03-10 MED ORDER — VITAMIN B-1 100 MG PO TABS
100.0000 mg | ORAL_TABLET | Freq: Every day | ORAL | Status: DC
  Administered 2019-03-11 – 2019-03-12 (×2): 100 mg via ORAL
  Filled 2019-03-10 (×2): qty 1

## 2019-03-10 MED ORDER — THIAMINE HCL 100 MG/ML IJ SOLN
500.0000 mg | Freq: Once | INTRAMUSCULAR | Status: AC
  Administered 2019-03-10: 500 mg via INTRAVENOUS
  Filled 2019-03-10: qty 6

## 2019-03-10 MED ORDER — ONDANSETRON HCL 4 MG PO TABS: 4.0000 mg | ORAL_TABLET | Freq: Four times a day (QID) | ORAL | Status: DC | PRN

## 2019-03-10 MED ORDER — THIAMINE HCL 100 MG/ML IJ SOLN
100.0000 mg | Freq: Every day | INTRAMUSCULAR | Status: DC
  Administered 2019-03-13: 100 mg via INTRAVENOUS
  Filled 2019-03-10: qty 2

## 2019-03-10 MED ORDER — IPRATROPIUM-ALBUTEROL 0.5-2.5 (3) MG/3ML IN SOLN: 3.0000 mL | Freq: Once | RESPIRATORY_TRACT | Status: DC

## 2019-03-10 MED ORDER — SODIUM CHLORIDE 0.9 % IV SOLN
INTRAVENOUS | Status: DC
  Administered 2019-03-10: 16:00:00 via INTRAVENOUS

## 2019-03-10 MED ORDER — SODIUM CHLORIDE 0.9 % IV BOLUS
500.0000 mL | Freq: Once | INTRAVENOUS | Status: AC
  Administered 2019-03-10: 500 mL via INTRAVENOUS

## 2019-03-10 MED ORDER — IPRATROPIUM-ALBUTEROL 0.5-2.5 (3) MG/3ML IN SOLN: 3.0000 mL | RESPIRATORY_TRACT | Status: DC | PRN

## 2019-03-10 MED ORDER — ACETAMINOPHEN 325 MG PO TABS: 650.0000 mg | ORAL_TABLET | Freq: Four times a day (QID) | ORAL | Status: DC | PRN

## 2019-03-10 MED ORDER — ACETAMINOPHEN 650 MG RE SUPP: 650.0000 mg | Freq: Four times a day (QID) | RECTAL | Status: DC | PRN

## 2019-03-10 MED ORDER — LABETALOL HCL 5 MG/ML IV SOLN: 10.0000 mg | INTRAVENOUS | Status: DC | PRN

## 2019-03-10 MED ORDER — MAGNESIUM SULFATE 2 GM/50ML IV SOLN
2.0000 g | Freq: Once | INTRAVENOUS | Status: AC
  Administered 2019-03-10: 2 g via INTRAVENOUS
  Filled 2019-03-10: qty 50

## 2019-03-10 NOTE — ED Notes (Signed)
Respiratory called for ABG

## 2019-03-10 NOTE — ED Notes (Signed)
Pt family given update.

## 2019-03-10 NOTE — ED Notes (Signed)
Pt wife called and informed pt has left unit and on the way to [facility]

## 2019-03-10 NOTE — ED Triage Notes (Signed)
Pt brought in from home for complaint of shortness of breath and recent falls. Pt has extensive bruising to left arm from fall yesterday, but no complaint of pain there. Pt c/o left hip pain and pelvis pain. Pt is not on oxygen at home and has significantly labored respirations.

## 2019-03-10 NOTE — ED Notes (Signed)
Adric [phone]

## 2019-03-10 NOTE — H&P (Signed)
History and Physical    HARTFORD MAULDEN PIR:[id number] DOB: April 20, 1946 DOA: 03/10/2019  PCP: Donnamarie Rossetti, PA-C   Patient coming from: Home  I have personally briefly reviewed patient's old medical records in [place]  Chief Complaint: Falls, left hip pain  HPI: Nathan Sellers is a 73 y.o. male with medical history significant for COPD, hypertension, peripheral vascular disease and coronary artery disease, presented to the ED with complaints of multiple falls, last fall was thursday, with bruising of his extremities.  He reports left hip pain, he is unable to ambulate.  Patient is extremely hard of hearing, I am able to get a few answers from him, so most of the history is obtained from chart review and talking to spouse. Patient tells me he is always short winded, he has a mild chronic cough which is unchanged.  He denies chest pain to me. I talked to patients spouse on the phone, she has not noticed any change in his breathing, he is chronically short of breath, she thinks his cough is slightly worse.  No vomiting, no loose stools, at baseline he does not eat a lot but his appetite has been poor over the past few days since he fell on Thursday.  Patient also drinks liquor every day- Haynes Hoehn, spouse reports 2 to 3 glasses of wine daily.  Spouse reports patient does not consistently take his antihypertensives.  ED Course: Tachycardic to 107, tachypneic 26-42, blood pressure systolic 10 7-1 64.  O2 sats greater than 93% on room air.  Sodium low 121, with low bicarb 16, with anion gap of 16, creatinine elevated at 2, blood alcohol level less than 10, Tylenol and salicylate levels unremarkable.  Lactic acid 2.3 >> 1.6.  Normal TSH.  UA with moderate hemoglobin, no bacteria.  Negative high-sensitivity troponin x2.  UDS Clean.  Hgb 9.7 down from 12.6 eight months ago.  Head CT negative for acute abnormality.  Chest x-ray right lower lobe pulmonary nodule.  Left forearm x-ray - distal soft  tissue swelling.  No fracture. Hip xray- shows a mildly displaced left femoral neck fracture.  Dr. Veverly Fells on call for Ortho at [facility] consulted recommended transfer over to [facility].  Hospitalist to admit for hyponatremia acute kidney injury and hip fracture.   Review of Systems: As per HPI all other systems reviewed and negative.  Past Medical History:  Diagnosis Date   COPD (chronic obstructive pulmonary disease) ([place])    Hypertension    Renal disorder     History reviewed. No pertinent surgical history.   reports that he has quit smoking. He has never used smokeless tobacco. He reports current alcohol use of about 3.0 standard drinks of alcohol per week. No history on file for drug.  Allergies  Allergen Reactions   Penicillins Hives    Family History  Problem Relation Age of Onset   Hypertension Other    CAD Mother    Kidney disease Mother    Alzheimer's disease Mother     Prior to Admission medications   Medication Sig Start Date End Date Taking? Authorizing Provider  atorvastatin (LIPITOR) 40 MG tablet Take 40 mg by mouth daily. 12/20/18  Yes [provider]  lisinopril-hydrochlorothiazide (ZESTORETIC) 20-12.5 MG tablet Take 1 tablet by mouth daily. 12/22/18  Yes [provider]  amLODipine (NORVASC) 5 MG tablet Take 1 tablet (5 mg total) by mouth daily. Patient not taking: Reported on 03/10/2019 07/26/18   Hillary Bow, MD  Physical Exam: Vitals:   03/10/19 1430 03/10/19 1530 03/10/19 1532 03/10/19 1600  BP: (!) 164/91 (!) 104/42  113/68  Pulse: (!) 102 (!) 115 (!) 114 (!) 111  Resp:  (!) 41 (!) 42 (!) 39  Temp:      TempSrc:      SpO2: 96% (!) 88% 94% 91%  Weight:      Height:        Constitutional: NAD, calm, comfortable, extremely hard of hearing Vitals:   03/10/19 1430 03/10/19 1530 03/10/19 1532 03/10/19 1600  BP: (!) 164/91 (!) 104/42  113/68  Pulse: (!) 102 (!) 115 (!) 114 (!) 111  Resp:  (!) 41 (!) 42 (!) 39  Temp:       TempSrc:      SpO2: 96% (!) 88% 94% 91%  Weight:      Height:       Eyes: PERRL, lids and conjunctivae normal ENMT: Mucous membranes are dry. Posterior pharynx clear of any exudate or lesions. Neck: normal, supple, no masses, no thyromegaly Respiratory: clear to auscultation bilaterally, no wheezing, no crackles.  With exertion, talking patient gets winded, with mild increased work of breathing.  No accessory muscle use.  Cardiovascular: Regular rate and rhythm, no murmurs / rubs / gallops. No extremity edema. 2+ pedal pulses. Abdomen: no tenderness, no masses palpated. No hepatosplenomegaly. Bowel sounds positive.  Musculoskeletal: no clubbing / cyanosis. Good ROM, no contractures. Normal muscle tone.  Left lower extremity not tested Skin: Diffuse ecchymoses on bilateral upper extremities. Firm swelling to mid forearm on dorsal surface, nontender. No induration Neurologic: CN 2-12 grossly intact.  5/5 strength in all extremities except left lower extremity not tested. Psychiatric: Normal judgment and insight. Alert and oriented x 3. Normal mood.   Labs on Admission: I have personally reviewed following labs and imaging studies  CBC: Recent Labs  Lab 03/10/19 1115  WBC 8.5  NEUTROABS 7.9*  HGB 9.7*  HCT 27.5*  MCV 106.2*  PLT 193   Basic Metabolic Panel: Recent Labs  Lab 03/10/19 1115  NA 121*  K 4.5  CL 89*  CO2 16*  GLUCOSE 107*  BUN 31*  CREATININE 2.04*  CALCIUM 8.2*   Liver Function Tests: Recent Labs  Lab 03/10/19 1115  AST 28  ALT 18  ALKPHOS 77  BILITOT 1.7*  PROT 6.8  ALBUMIN 3.4*   Recent Labs  Lab 03/10/19 1115  LIPASE 33   Recent Labs  Lab 03/10/19 1115  AMMONIA 18   Coagulation Profile: Recent Labs  Lab 03/10/19 1115  INR 1.1   Thyroid Function Tests: Recent Labs    03/10/19 1118  TSH 1.518   Urine analysis:    Component Value Date/Time   COLORURINE YELLOW 03/10/2019 1317   APPEARANCEUR CLEAR 03/10/2019 1317   LABSPEC  1.012 03/10/2019 1317   PHURINE 5.0 03/10/2019 1317   GLUCOSEU NEGATIVE 03/10/2019 1317   HGBUR MODERATE (A) 03/10/2019 1317   BILIRUBINUR NEGATIVE 03/10/2019 1317   KETONESUR 5 (A) 03/10/2019 1317   PROTEINUR NEGATIVE 03/10/2019 1317   NITRITE NEGATIVE 03/10/2019 1317   LEUKOCYTESUR NEGATIVE 03/10/2019 1317    Radiological Exams on Admission: Dg Pelvis 1-2 Views  Result Date: 03/10/2019 CLINICAL DATA:  Fall yesterday. Pelvis and left hip pain. EXAM: PELVIS - 1-2 VIEW COMPARISON:  None. FINDINGS: A mildly displaced left femoral neck fracture is seen. No evidence of hip dislocation. No evidence of pelvic fracture. Extensive atherosclerotic calcification involving the iliac arteries. IMPRESSION: Mildly displaced left femoral  neck fracture. Electronically Signed   By: Danae OrleansJohn A Stahl M.D.   On: 03/10/2019 14:23   Dg Forearm Left  Result Date: 03/10/2019 CLINICAL DATA:  Fall yesterday. Left forearm pain and bruising. Initial encounter. EXAM: LEFT FOREARM - 2 VIEW COMPARISON:  None. FINDINGS: There is no evidence of fracture or other focal bone lesions. Moderate soft tissue swelling is seen involving the distal forearm. Mild peripheral vascular calcification also seen. IMPRESSION: Distal forearm soft tissue swelling. No evidence of fracture. Electronically Signed   By: Danae OrleansJohn A Stahl M.D.   On: 03/10/2019 14:21   Ct Head Wo Contrast  Result Date: 03/10/2019 CLINICAL DATA:  Recent fall. Minor blunt head trauma. Initial encounter. EXAM: CT HEAD WITHOUT CONTRAST TECHNIQUE: Contiguous axial images were obtained from the base of the skull through the vertex without intravenous contrast. COMPARISON:  None. FINDINGS: Brain: No evidence of acute infarction, hemorrhage, hydrocephalus, extra-axial collection, or mass lesion/mass effect. Mild diffuse cerebral atrophy and chronic small vessel disease. Vascular:  No hyperdense vessel or other acute findings. Skull: No evidence of fracture or other significant bone  abnormality. Sinuses/Orbits:  No acute findings. Other: None. IMPRESSION: No acute intracranial abnormality. Mild cerebral atrophy and chronic small vessel disease. Electronically Signed   By: Danae OrleansJohn A Stahl M.D.   On: 03/10/2019 14:07   Dg Chest Portable 1 View  Result Date: 03/10/2019 CLINICAL DATA:  Shortness of breath. Dyspnea. COPD. Recent falls. EXAM: PORTABLE CHEST 1 VIEW COMPARISON:  07/25/2018 FINDINGS: The heart size and mediastinal contours are within normal limits. Aortic atherosclerosis. No evidence of acute pulmonary infiltrate or edema. No evidence of pleural effusion. A small nodular density is seen in right lower lobe, as seen on recent PET-CT. The visualized skeletal structures are unremarkable. IMPRESSION: 1. No acute findings. 2. Right lower lobe pulmonary nodule, as seen on recent PET-CT. Electronically Signed   By: Danae OrleansJohn A Stahl M.D.   On: 03/10/2019 14:20    EKG: Independently reviewed.  Sinus tachycardia, QTC 466.  No significant ST or T wave abnormalities.  No old EKG to compare.  Assessment/Plan Active Problems:   Hyponatremia   Hyponatremia- sodium 121.  Baseline appears to be 131-134 (per care-everywhere).  Likely multifactorial 2/2 dehydration and beer potomania, in the setting of intermittent diuretic- HCTZ use.  Likely mildly symptomatic with gait abnormalities causing multiple falls.  TSH normal 1.5.  Prior hospitalization for hyponatremia sodium 123, thought secondary to HCTZ.  Per care everywhere his diuretic was discontinued but that appears patient is still taking this medication. 500ml bolus given in ED -Follow-up serum and urine osmolality -Urine sodium -Follow-up BMP every 4 hourly x4 - N/s 100cc/hr x 1 day  Acute kidney injury-creatinine 2, baseline ~1.3.  Likely prerenal, with mild lactic acidosis 2.3 >> 1.6, remittent soft blood pressure to 104 systolic. -Hydrate -BNP a.m.  Anion gap metabolic acidosis-anion gap 16, bicarb 16.  Likely secondary to mild  lactic acidosis, and acute renal insufficiency, versus low bicarb as compensation for respiratory alkalosis.  Unremarkable salicylate level. -Hydrate.  Left femur fracture-sustained after multiple falls 2/2 alcohol abuse and hyponatremia.  Orthopedics consulted by EDP, Dr Ranell PatrickNorris recommended transfer to: For possible surgery in a.m. -N.p.o. midnight -IV morphine 2 mg PRN  Alcohol abuse-last drink Thursday, no drinks since he fell.  Drinks 2 to 3 glasses of liquor- [place] daily. - CIWA, thiamine folate multivitamins -Magnesium- 1.5 - phosphorus-normal at 3.4  Acute anemia-hemoglobin 9.7, baseline 12-13.  Per spouse no black stools.  -Anemia panel - CBC  a.m.  Hypertension-stable.  Patient intermittently takes his antihypertensives-Norvasc, lisinopril HCTZ.  HCTZ was d/c after last hospitalization for hyponatremia. -PRN labetalol -Hold home Norvasc and lisinopril HCTZ for now with intermittent soft blood pressure and hyponatremia.  COPD- patient easily gets winded with exertion, with mild increased cough.  No wheezing on exam. Initial tachypnea has resolved.  O2 sats greater than 93% on room air. But persistently tachycardic likely secondary to uncontrolled pain..  At this time very low suspicion for PE, hence did not pursue further work-up/imaging, also he has a new anemia.  Chest x-ray negative for acute abnormality. -ABG shows respiratory alkalosis pH 7.4, PCO2 20 with metabolic compensation-serum bicarb 16 -DuoNebs PRN  Peripheral vascular disease, coronary artery disease- stable.  Care everywhere coronary artery disease was noted on CAT scan. -Continue home statins  Pulmonary nodule- as seen on recent PET/CT -Follow-up as outpatient  DVT prophylaxis: SCds Code Status: DNR, as documented previously in chart and confirmed with spouse. Family Communication: Spouse on the phone.  Talked extensively with spouse, all questions answered, plan of care explained. Disposition Plan: Per  rounding team Consults called: Orthopedics Admission status: Inpatient, telemetry I certify that at the point of admission it is my clinical judgment that the patient will require inpatient hospital care spanning beyond 2 midnights from the point of admission due to high intensity of service, high risk for further deterioration and high frequency of surveillance required. The following factors support the patient status of inpatient: Hyponatremia, acute kidney injury and pelvic fracture that requires surgery.   Onnie BoerEjiroghene E Tyger Oka MD [facility]  03/10/2019, 4:45 PM

## 2019-03-10 NOTE — ED Notes (Signed)
Date and time results received: 03/10/19 1145 (use smartphrase ".now" to insert current time)  Test: Lactic Critical Value: 2.3  Name of Provider Notified: Dr Laverta Baltimore Orders Received? Or Actions Taken?: NA

## 2019-03-10 NOTE — ED Notes (Signed)
Dr Denton Brick reviewed pt ABG

## 2019-03-10 NOTE — ED Notes (Signed)
Hospitalist at bedside 

## 2019-03-10 NOTE — Progress Notes (Signed)
Patient transferred to [facility] from [facility]. Patient A&O x4 and very hard of hearing. Placed on telemetry. Large bruise noted to left arm and smaller bruises scattered and on left hip. Floor mats in place due to history of falls, low bed contraindicated for hip precautions. Will continue to monitor.

## 2019-03-10 NOTE — ED Notes (Signed)
[facility] at bedside 

## 2019-03-10 NOTE — ED Notes (Signed)
ED TO INPATIENT HANDOFF REPORT  ED Nurse Name and Phone #: [phone]  S Name/Age/Gender Nathan Sellers Nathan Sellers 73 y.o. male Room/Bed: APA06/APA06  Code Status   Code Status: Prior  Home/SNF/Other Home Patient oriented to: self Is this baseline? Yes   Triage Complete: Triage complete  Chief Complaint Weakness; Urinary Retention  Triage Note Pt brought in from home for complaint of shortness of breath and recent falls. Pt has extensive bruising to left arm from fall yesterday, but no complaint of pain there. Pt c/o left hip pain and pelvis pain. Pt is not on oxygen at home and has significantly labored respirations.    Allergies Allergies  Allergen Reactions  . Penicillins Hives    Level of Care/Admitting Diagnosis ED Disposition    ED Disposition Condition Comment   Admit  Hospital Area: [facility] [[id number]]  Level of Care: Telemetry Medical [104]  Covid Evaluation: N/A  Diagnosis: Hyponatremia [147829]  Admitting Physician: Onnie Boer [[id number]]  Attending Physician: Onnie Boer [phone]  Estimated length of stay: past midnight tomorrow  Certification:: I certify this patient will need inpatient services for at least 2 midnights  PT Class (Do Not Modify): Inpatient [101]  PT Acc Code (Do Not Modify): Private [1]       B Medical/Surgery History Past Medical History:  Diagnosis Date  . COPD (chronic obstructive pulmonary disease) (HCC)   . Hypertension   . Renal disorder    History reviewed. No pertinent surgical history.   A IV Location/Drains/Wounds Patient Lines/Drains/Airways Status   Active Line/Drains/Airways    Name:   Placement date:   Placement time:   Site:   Days:   Peripheral IV 03/10/19 Right Forearm   03/10/19    1108    Forearm   less than 1          Intake/Output Last 24 hours  Intake/Output Summary (Last 24 hours) at 03/10/2019 1625 Last data filed at 03/10/2019 1158 Gross per 24 hour  Intake 500 ml  Output  -  Net 500 ml    Labs/Imaging Results for orders placed or performed during the hospital encounter of 03/10/19 (from the past 48 hour(s))  Comprehensive metabolic panel     Status: Abnormal   Collection Time: 03/10/19 11:15 AM  Result Value Ref Range   Sodium 121 (L) 135 - 145 mmol/L   Potassium 4.5 3.5 - 5.1 mmol/L   Chloride 89 (L) 98 - 111 mmol/L   CO2 16 (L) 22 - 32 mmol/L   Glucose, Bld 107 (H) 70 - 99 mg/dL   BUN 31 (H) 8 - 23 mg/dL   Creatinine, Ser 5.78 (H) 0.61 - 1.24 mg/dL   Calcium 8.2 (L) 8.9 - 10.3 mg/dL   Total Protein 6.8 6.5 - 8.1 g/dL   Albumin 3.4 (L) 3.5 - 5.0 g/dL   AST 28 15 - 41 U/L   ALT 18 0 - 44 U/L   Alkaline Phosphatase 77 38 - 126 U/L   Total Bilirubin 1.7 (H) 0.3 - 1.2 mg/dL   GFR calc non Af Amer 32 (L) >60 mL/min   GFR calc Af Amer 37 (L) >60 mL/min   Anion gap 16 (H) 5 - 15    Comment: Performed at [facility], [address] [place], [place] [address]  Ethanol     Status: None   Collection Time: 03/10/19 11:15 AM  Result Value Ref Range   Alcohol, Ethyl (B) <10 <10 mg/dL    Comment: (  NOTE) Lowest detectable limit for serum alcohol is 10 mg/dL. For medical purposes only. Performed at [facility], [address] [place], [place] [address]   Lipase, blood     Status: None   Collection Time: 03/10/19 11:15 AM  Result Value Ref Range   Lipase 33 11 - 51 U/L    Comment: Performed at [facility], [address] [place], [place] [address]  Salicylate level     Status: None   Collection Time: 03/10/19 11:15 AM  Result Value Ref Range   Salicylate Lvl <2.7 2.8 - 30.0 mg/dL    Comment: Performed at [facility], [address], [place], [place] [address]  Acetaminophen level     Status: Abnormal   Collection Time: 03/10/19 11:15 AM  Result Value Ref Range   Acetaminophen (Tylenol), Serum <10 (L) 10 - 30 ug/mL    Comment: (NOTE) Therapeutic concentrations vary significantly. A range of 10-30 ug/mL  may be an effective concentration for many  patients. However, some  are best treated at concentrations outside of this range. Acetaminophen concentrations >150 ug/mL at 4 hours after ingestion  and >50 ug/mL at 12 hours after ingestion are often associated with  toxic reactions. Performed at [facility], [address] [place], [place] [address]   Troponin I (High Sensitivity)     Status: None   Collection Time: 03/10/19 11:15 AM  Result Value Ref Range   Troponin I (High Sensitivity) 14.00 <18 ng/L    Comment: (NOTE) Elevated high sensitivity troponin I (hsTnI) values and significant  changes across serial measurements may suggest ACS but many other  chronic and acute conditions are known to elevate hsTnI results.  Refer to the "Links" section for chest pain algorithms and additional  guidance. Performed at [facility], [address] [place], [place] [address]   Lactic acid, plasma     Status: Abnormal   Collection Time: 03/10/19 11:15 AM  Result Value Ref Range   Lactic Acid, Venous 2.3 (HH) 0.5 - 1.9 mmol/L    Comment: CRITICAL RESULT CALLED TO, READ BACK BY AND VERIFIED WITH: Jovi Alvizo H. @ 1144 ON @ [id number] BY HENDERSON L. Performed at [facility], [address], [place], [place] [address]   CBC with Differential     Status: Abnormal   Collection Time: 03/10/19 11:15 AM  Result Value Ref Range   WBC 8.5 4.0 - 10.5 K/uL   RBC 2.59 (L) 4.22 - 5.81 MIL/uL   Hemoglobin 9.7 (L) 13.0 - 17.0 g/dL   HCT 27.5 (L) 39.0 - 52.0 %   MCV 106.2 (H) 80.0 - 100.0 fL   MCH 37.5 (H) 26.0 - 34.0 pg   MCHC 35.3 30.0 - 36.0 g/dL   RDW 13.6 11.5 - 15.5 %   Platelets 193 150 - 400 K/uL   nRBC 0.0 0.0 - 0.2 %   Neutrophils Relative % 92 %   Neutro Abs 7.9 (H) 1.7 - 7.7 K/uL   Lymphocytes Relative 4 %   Lymphs Abs 0.3 (L) 0.7 - 4.0 K/uL   Monocytes Relative 3 %   Monocytes Absolute 0.2 0.1 - 1.0 K/uL   Eosinophils Relative 0 %   Eosinophils Absolute 0.0 0.0 - 0.5 K/uL   Basophils Relative 0 %   Basophils Absolute 0.0 0.0 - 0.1  K/uL   Immature Granulocytes 1 %   Abs Immature Granulocytes 0.10 (H) 0.00 - 0.07 K/uL    Comment: Performed at [facility], [address] [place], [place]  [id number]  Protime-INR     Status: None   Collection Time: 03/10/19 11:15 AM  Result Value Ref Range   Prothrombin Time 13.8 11.4 - 15.2 seconds   INR 1.1 0.8 - 1.2    Comment: (NOTE) INR goal varies based on device and disease states. Performed at [facility], [address] [place], [place] [address]   Ammonia     Status: None   Collection Time: 03/10/19 11:15 AM  Result Value Ref Range   Ammonia 18 9 - 35 umol/L    Comment: Performed at [facility], [address] [place], [place] [address]  TSH     Status: None   Collection Time: 03/10/19 11:18 AM  Result Value Ref Range   TSH 1.518 0.350 - 4.500 uIU/mL    Comment: Performed by a 3rd Generation assay with a functional sensitivity of <=0.01 uIU/mL. Performed at [facility], [address] [place], [place] [address]   SARS Coronavirus 2 (CEPHEID- Performed in [facility] hospital lab), Hosp Order     Status: None   Collection Time: 03/10/19 11:31 AM   Specimen: Nasopharyngeal Swab  Result Value Ref Range   SARS Coronavirus 2 NEGATIVE NEGATIVE    Comment: (NOTE) If result is NEGATIVE SARS-CoV-2 target nucleic acids are NOT DETECTED. The SARS-CoV-2 RNA is generally detectable in upper and lower  respiratory specimens during the acute phase of infection. The lowest  concentration of SARS-CoV-2 viral copies this assay can detect is 250  copies / mL. A negative result does not preclude SARS-CoV-2 infection  and should not be used as the sole basis for treatment or other  patient management decisions.  A negative result may occur with  improper specimen collection / handling, submission of specimen other  than nasopharyngeal swab, presence of viral mutation(s) within the  areas targeted by this assay, and inadequate number of viral copies  (<250 copies / mL). A negative  result must be combined with clinical  observations, patient history, and epidemiological information. If result is POSITIVE SARS-CoV-2 target nucleic acids are DETECTED. The SARS-CoV-2 RNA is generally detectable in upper and lower  respiratory specimens dur ing the acute phase of infection.  Positive  results are indicative of active infection with SARS-CoV-2.  Clinical  correlation with patient history and other diagnostic information is  necessary to determine patient infection status.  Positive results do  not rule out bacterial infection or co-infection with other viruses. If result is PRESUMPTIVE POSTIVE SARS-CoV-2 nucleic acids MAY BE PRESENT.   A presumptive positive result was obtained on the submitted specimen  and confirmed on repeat testing.  While 2019 novel coronavirus  (SARS-CoV-2) nucleic acids may be present in the submitted sample  additional confirmatory testing may be necessary for epidemiological  and / or clinical management purposes  to differentiate between  SARS-CoV-2 and other Sarbecovirus currently known to infect humans.  If clinically indicated additional testing with an alternate test  methodology [phone] is advised. The SARS-CoV-2 RNA is generally  detectable in upper and lower respiratory sp ecimens during the acute  phase of infection. The expected result is Negative. Fact Sheet for Patients:  BoilerBrush.com.cyhttps://www.fda.gov/media/136312/download Fact Sheet for Healthcare Providers: https://pope.com/https://www.fda.gov/media/136313/download This test is not yet approved or cleared by the [place] FDA and has been authorized for detection and/or diagnosis of SARS-CoV-2 by FDA under an Emergency Use Authorization (EUA).  This EUA will remain in effect (meaning this test can be used) for the duration of the COVID-19 declaration under  Section 564(b)(1) of the Act, 21 U.S.C. section 360bbb-3(b)(1), unless the authorization is terminated or revoked sooner. Performed at [facility], [address] [place], [place] [address]   Lactic acid, plasma     Status: None   Collection Time: 03/10/19 12:44 PM  Result Value Ref Range   Lactic Acid, Venous 1.6 0.5 - 1.9 mmol/L    Comment: Performed at [facility], [address] [place], [place] [address]  Troponin I (High Sensitivity)     Status: None   Collection Time: 03/10/19 12:44 PM  Result Value Ref Range   Troponin I (High Sensitivity) 14.00 <18 ng/L    Comment: (NOTE) Elevated high sensitivity troponin I (hsTnI) values and significant  changes across serial measurements may suggest ACS but many other  chronic and acute conditions are known to elevate hsTnI results.  Refer to the "Links" section for chest pain algorithms and additional  guidance. Performed at [facility], [address] [place], [place] [address]   Urinalysis, Routine w reflex microscopic     Status: Abnormal   Collection Time: 03/10/19  1:17 PM  Result Value Ref Range   Color, Urine YELLOW YELLOW   APPearance CLEAR CLEAR   Specific Gravity, Urine 1.012 1.005 - 1.030   pH 5.0 5.0 - 8.0   Glucose, UA NEGATIVE NEGATIVE mg/dL   Hgb urine dipstick MODERATE (A) NEGATIVE   Bilirubin Urine NEGATIVE NEGATIVE   Ketones, ur 5 (A) NEGATIVE mg/dL   Protein, ur NEGATIVE NEGATIVE mg/dL   Nitrite NEGATIVE NEGATIVE   Leukocytes,Ua NEGATIVE NEGATIVE   RBC / HPF 0-5 0 - 5 RBC/hpf   WBC, UA 6-10 0 - 5 WBC/hpf   Bacteria, UA NONE SEEN NONE SEEN   Squamous Epithelial / LPF 0-5 0 - 5   Mucus PRESENT     Comment: Performed at [facility], [address] [place], [place] [address]  Urine rapid drug screen (hosp performed)     Status: None   Collection Time: 03/10/19  1:17 PM  Result Value Ref Range   Opiates NONE DETECTED NONE DETECTED   Cocaine NONE DETECTED NONE DETECTED   Benzodiazepines NONE DETECTED NONE DETECTED   Amphetamines NONE DETECTED NONE DETECTED   Tetrahydrocannabinol NONE DETECTED NONE DETECTED   Barbiturates NONE DETECTED NONE  DETECTED    Comment: (NOTE) DRUG SCREEN FOR MEDICAL PURPOSES ONLY.  IF CONFIRMATION IS NEEDED FOR ANY PURPOSE, NOTIFY LAB WITHIN 5 DAYS. LOWEST DETECTABLE LIMITS FOR URINE DRUG SCREEN Drug Class                     Cutoff (ng/mL) Amphetamine and metabolites    1000 Barbiturate and metabolites    200 Benzodiazepine                 200 Tricyclics and metabolites     300 Opiates and metabolites        300 Cocaine and metabolites        300 THC                            50 Performed at [facility], [address] [place], [place] [address]   Sodium, urine, random     Status: None   Collection Time: 03/10/19  1:17 PM  Result Value Ref Range   Sodium, Ur 31 mmol/L    Comment: Performed at [facility], [address] [place], [place] [address]   Dg  Pelvis 1-2 Views  Result Date: 03/10/2019 CLINICAL DATA:  Fall yesterday. Pelvis and left hip pain. EXAM: PELVIS - 1-2 VIEW COMPARISON:  None. FINDINGS: A mildly displaced left femoral neck fracture is seen. No evidence of hip dislocation. No evidence of pelvic fracture. Extensive atherosclerotic calcification involving the iliac arteries. IMPRESSION: Mildly displaced left femoral neck fracture. Electronically Signed   By: Danae OrleansJohn A Stahl M.D.   On: 03/10/2019 14:23   Dg Forearm Left  Result Date: 03/10/2019 CLINICAL DATA:  Fall yesterday. Left forearm pain and bruising. Initial encounter. EXAM: LEFT FOREARM - 2 VIEW COMPARISON:  None. FINDINGS: There is no evidence of fracture or other focal bone lesions. Moderate soft tissue swelling is seen involving the distal forearm. Mild peripheral vascular calcification also seen. IMPRESSION: Distal forearm soft tissue swelling. No evidence of fracture. Electronically Signed   By: Danae OrleansJohn A Stahl M.D.   On: 03/10/2019 14:21   Ct Head Wo Contrast  Result Date: 03/10/2019 CLINICAL DATA:  Recent fall. Minor blunt head trauma. Initial encounter. EXAM: CT HEAD WITHOUT CONTRAST TECHNIQUE: Contiguous axial images  were obtained from the base of the skull through the vertex without intravenous contrast. COMPARISON:  None. FINDINGS: Brain: No evidence of acute infarction, hemorrhage, hydrocephalus, extra-axial collection, or mass lesion/mass effect. Mild diffuse cerebral atrophy and chronic small vessel disease. Vascular:  No hyperdense vessel or other acute findings. Skull: No evidence of fracture or other significant bone abnormality. Sinuses/Orbits:  No acute findings. Other: None. IMPRESSION: No acute intracranial abnormality. Mild cerebral atrophy and chronic small vessel disease. Electronically Signed   By: Danae OrleansJohn A Stahl M.D.   On: 03/10/2019 14:07   Dg Chest Portable 1 View  Result Date: 03/10/2019 CLINICAL DATA:  Shortness of breath. Dyspnea. COPD. Recent falls. EXAM: PORTABLE CHEST 1 VIEW COMPARISON:  07/25/2018 FINDINGS: The heart size and mediastinal contours are within normal limits. Aortic atherosclerosis. No evidence of acute pulmonary infiltrate or edema. No evidence of pleural effusion. A small nodular density is seen in right lower lobe, as seen on recent PET-CT. The visualized skeletal structures are unremarkable. IMPRESSION: 1. No acute findings. 2. Right lower lobe pulmonary nodule, as seen on recent PET-CT. Electronically Signed   By: Danae OrleansJohn A Stahl M.D.   On: 03/10/2019 14:20    Pending Labs Unresulted Labs (From admission, onward)    Start     Ordered   03/10/19 1700  Basic metabolic panel  Once,   STAT     03/10/19 1556   03/10/19 1605  Magnesium  Add-on,   AD     03/10/19 1604   03/10/19 1605  Phosphorus  Add-on,   AD     03/10/19 1604   03/10/19 1555  Blood gas, arterial  Once,   R     03/10/19 1555   03/10/19 1531  Osmolality  Add-on,   AD     03/10/19 1530   03/10/19 1531  Osmolality, urine  Add-on,   AD     03/10/19 1530   03/10/19 1059  Vitamin B1  Once,   STAT     03/10/19 1100   Signed and Held  Basic metabolic panel  Now then every 4 hours,   R     Signed and Held           Vitals/Pain Today's Vitals   03/10/19 1430 03/10/19 1530 03/10/19 1532 03/10/19 1600  BP: (!) 164/91 (!) 104/42  113/68  Pulse: (!) 102 (!) 115 (!) 114 (!) 111  Resp:  Marland Kitchen(!)  41 (!) 42 (!) 39  Temp:      TempSrc:      SpO2: 96% (!) 88% 94% 91%  Weight:      Height:      PainSc:        Isolation Precautions No active isolations  Medications Medications  0.9 %  sodium chloride infusion ( Intravenous New Bag/Given 03/10/19 1607)  morphine 2 MG/ML injection 2 mg (has no administration in time range)  sodium chloride 0.9 % bolus 500 mL (0 mLs Intravenous Stopped 03/10/19 1158)  thiamine (B-1) injection 500 mg (500 mg Intravenous Given 03/10/19 1129)    Mobility walks High fall risk   Focused Assessments    R Recommendations: See Admitting Provider Note  Report given to:   Additional Notes:

## 2019-03-10 NOTE — ED Notes (Signed)
Respiratory at bedside.

## 2019-03-10 NOTE — ED Provider Notes (Signed)
Emergency Department Provider Note   I have reviewed the triage vital signs and the nursing notes.   HISTORY  Chief Complaint Shortness of Breath   HPI Nathan Sellers is a 73 y.o. male with PMH of COPD and HTN presents to the emergency department for evaluation of shortness of breath, recent falls, and pain in both legs.  Patient reports a history of frequent drinking.  He had multiple falls within the past few days including yesterday.  He has bruising over both arms which he states is more from his family trying to pick him up.  He tells me that he has history of tobacco use and COPD.  He tells me he is always "short winded" but is more so today and yesterday.  He denies fevers or shaking chills.  No abdominal or chest pain.  No lower back pain.  No unilateral weakness or numbness.   Attempted to contact wife for collateral information but did not answer phone.   Past Medical History:  Diagnosis Date  . COPD (chronic obstructive pulmonary disease) ([place])   . Hypertension   . Renal disorder     Patient Active Problem List   Diagnosis Date Noted  . Hyponatremia 07/25/2018    History reviewed. No pertinent surgical history.  Allergies Penicillins  Family History  Problem Relation Age of Onset  . Hypertension Other   . CAD Mother   . Kidney disease Mother   . Alzheimer's disease Mother     Social History Social History   Tobacco Use  . Smoking status: Former Research scientist (life sciences)  . Smokeless tobacco: Never Used  Substance Use Topics  . Alcohol use: Yes    Alcohol/week: 3.0 standard drinks    Types: 3 Shots of liquor per week  . Drug use: Not on file    Review of Systems  Constitutional: No fever/chills. Positive frequent falls.  Eyes: No visual changes. ENT: No sore throat. Cardiovascular: Denies chest pain. Respiratory: Positive shortness of breath. Gastrointestinal: No abdominal pain.  No nausea, no vomiting.  No diarrhea.  No constipation. Genitourinary: Negative  for dysuria. Musculoskeletal: Negative for back pain. Positive bilateral pain in the legs.  Skin: Negative for rash. Neurological: Negative for headaches, focal weakness or numbness.   10-point ROS otherwise negative.  ____________________________________________   PHYSICAL EXAM:  VITAL SIGNS: ED Triage Vitals  Enc Vitals Group     BP 03/10/19 1041 (!) 151/106     Pulse Rate 03/10/19 1041 (!) 102     Resp 03/10/19 1041 (!) 38     Temp 03/10/19 1041 97.9 F (36.6 C)     Temp Source 03/10/19 1041 Oral     SpO2 --      Weight 03/10/19 1042 130 lb (59 kg)     Height 03/10/19 1042 5\' 7"  (1.702 m)   Constitutional: Alert and oriented. Well appearing and in no acute distress. Hard of hearing.  Eyes: Conjunctivae are normal.  Head: Atraumatic. Nose: No congestion/rhinnorhea. Mouth/Throat: Mucous membranes are moist.  Neck: No stridor.   Cardiovascular: Tachycardia. Good peripheral circulation. Grossly normal heart sounds.   Respiratory: Increased respiratory effort.  No retractions. Lungs CTAB. Gastrointestinal: Soft and nontender. No distention.  Musculoskeletal: No lower extremity tenderness nor edema. No gross deformities of extremities. Normal ROM of bilateral hips and knees.  Neurologic:  Normal speech and language. No gross focal neurologic deficits are appreciated.  Normal strength while sitting in bed and lifting his lower extremities.  Defer ambulation at bedside given  his increased respiratory rate.  Skin:  Skin is warm, dry and intact. Ecchymosis over the bilateral forearms, worse on the left.   ____________________________________________   LABS (all labs ordered are listed, but only abnormal results are displayed)  Labs Reviewed  COMPREHENSIVE METABOLIC PANEL - Abnormal; Notable for the following components:      Result Value   Sodium 121 (*)    Chloride 89 (*)    CO2 16 (*)    Glucose, Bld 107 (*)    BUN 31 (*)    Creatinine, Ser 2.04 (*)    Calcium 8.2 (*)     Albumin 3.4 (*)    Total Bilirubin 1.7 (*)    GFR calc non Af Amer 32 (*)    GFR calc Af Amer 37 (*)    Anion gap 16 (*)    All other components within normal limits  ACETAMINOPHEN LEVEL - Abnormal; Notable for the following components:   Acetaminophen (Tylenol), Serum <10 (*)    All other components within normal limits  LACTIC ACID, PLASMA - Abnormal; Notable for the following components:   Lactic Acid, Venous 2.3 (*)    All other components within normal limits  CBC WITH DIFFERENTIAL/PLATELET - Abnormal; Notable for the following components:   RBC 2.59 (*)    Hemoglobin 9.7 (*)    HCT 27.5 (*)    MCV 106.2 (*)    MCH 37.5 (*)    Neutro Abs 7.9 (*)    Lymphs Abs 0.3 (*)    Abs Immature Granulocytes 0.10 (*)    All other components within normal limits  URINALYSIS, ROUTINE W REFLEX MICROSCOPIC - Abnormal; Notable for the following components:   Hgb urine dipstick MODERATE (*)    Ketones, ur 5 (*)    All other components within normal limits  BLOOD GAS, ARTERIAL - Abnormal; Notable for the following components:   pH, Arterial 7.472 (*)    pCO2 arterial 20.7 (*)    pO2, Arterial 52.3 (*)    Bicarbonate 18.3 (*)    Acid-base deficit 8.1 (*)    All other components within normal limits  BASIC METABOLIC PANEL - Abnormal; Notable for the following components:   Sodium 122 (*)    Chloride 93 (*)    CO2 16 (*)    BUN 33 (*)    Creatinine, Ser 1.80 (*)    Calcium 7.7 (*)    GFR calc non Af Amer 37 (*)    GFR calc Af Amer 43 (*)    All other components within normal limits  MAGNESIUM - Abnormal; Notable for the following components:   Magnesium 1.5 (*)    All other components within normal limits  SARS CORONAVIRUS 2 (HOSPITAL ORDER, PERFORMED IN [facility])  ETHANOL  LIPASE, BLOOD  SALICYLATE LEVEL  LACTIC ACID, PLASMA  PROTIME-INR  AMMONIA  TSH  RAPID URINE DRUG SCREEN, HOSP PERFORMED  SODIUM, URINE, RANDOM  PHOSPHORUS  VITAMIN B1  OSMOLALITY   OSMOLALITY, URINE  BASIC METABOLIC PANEL  BASIC METABOLIC PANEL  BASIC METABOLIC PANEL  TROPONIN I (HIGH SENSITIVITY)  TROPONIN I (HIGH SENSITIVITY)   ____________________________________________  EKG   EKG Interpretation  Date/Time:  Sunday March 10 2019 10:43:43 EDT Ventricular Rate:  101 PR Interval:    QRS Duration: 83 QT Interval:  359 QTC Calculation: 466 R Axis:   24 Text Interpretation:  Sinus tachycardia No STEMI  Confirmed by Alona BeneLong,  [phone]([id number]) on 03/10/2019 11:03:34 AM  ____________________________________________  RADIOLOGY  Dg Pelvis 1-2 Views  Result Date: 03/10/2019 CLINICAL DATA:  Fall yesterday. Pelvis and left hip pain. EXAM: PELVIS - 1-2 VIEW COMPARISON:  None. FINDINGS: A mildly displaced left femoral neck fracture is seen. No evidence of hip dislocation. No evidence of pelvic fracture. Extensive atherosclerotic calcification involving the iliac arteries. IMPRESSION: Mildly displaced left femoral neck fracture. Electronically Signed   By: Danae OrleansJohn A Stahl M.D.   On: 03/10/2019 14:23   Dg Forearm Left  Result Date: 03/10/2019 CLINICAL DATA:  Fall yesterday. Left forearm pain and bruising. Initial encounter. EXAM: LEFT FOREARM - 2 VIEW COMPARISON:  None. FINDINGS: There is no evidence of fracture or other focal bone lesions. Moderate soft tissue swelling is seen involving the distal forearm. Mild peripheral vascular calcification also seen. IMPRESSION: Distal forearm soft tissue swelling. No evidence of fracture. Electronically Signed   By: Danae OrleansJohn A Stahl M.D.   On: 03/10/2019 14:21   Ct Head Wo Contrast  Result Date: 03/10/2019 CLINICAL DATA:  Recent fall. Minor blunt head trauma. Initial encounter. EXAM: CT HEAD WITHOUT CONTRAST TECHNIQUE: Contiguous axial images were obtained from the base of the skull through the vertex without intravenous contrast. COMPARISON:  None. FINDINGS: Brain: No evidence of acute infarction, hemorrhage, hydrocephalus, extra-axial  collection, or mass lesion/mass effect. Mild diffuse cerebral atrophy and chronic small vessel disease. Vascular:  No hyperdense vessel or other acute findings. Skull: No evidence of fracture or other significant bone abnormality. Sinuses/Orbits:  No acute findings. Other: None. IMPRESSION: No acute intracranial abnormality. Mild cerebral atrophy and chronic small vessel disease. Electronically Signed   By: Danae OrleansJohn A Stahl M.D.   On: 03/10/2019 14:07   Dg Chest Portable 1 View  Result Date: 03/10/2019 CLINICAL DATA:  Shortness of breath. Dyspnea. COPD. Recent falls. EXAM: PORTABLE CHEST 1 VIEW COMPARISON:  07/25/2018 FINDINGS: The heart size and mediastinal contours are within normal limits. Aortic atherosclerosis. No evidence of acute pulmonary infiltrate or edema. No evidence of pleural effusion. A small nodular density is seen in right lower lobe, as seen on recent PET-CT. The visualized skeletal structures are unremarkable. IMPRESSION: 1. No acute findings. 2. Right lower lobe pulmonary nodule, as seen on recent PET-CT. Electronically Signed   By: Danae OrleansJohn A Stahl M.D.   On: 03/10/2019 14:20    ____________________________________________   PROCEDURES  Procedure(s) performed:   Procedures  None ____________________________________________   INITIAL IMPRESSION / ASSESSMENT AND PLAN / ED COURSE  Pertinent labs & imaging results that were available during my care of the patient were reviewed by me and considered in my medical decision making (see chart for details).   Patient presents to the emergency department for evaluation of frequent falls, rapid breathing, and pain in both legs.  Patient appears to have equal strength and sensation in the lower extremities while lying in bed.  Defer ambulation at this time given frequent fall history and rapid breathing on arrival.  Patient with tachycardia and tachypnea here.  Differential is broad at this time.  With frequent falling have some concern for  possible Warnicke's given his history of drinking.  Will send a thiamine level and give 500 IV thiamine here.  No significant wheezing on exam.  PE is a consideration along with ACS.  Further testing ordered.  Will obtain plain films of the left forearm with significant bruising.  Patient lab work reviewed.  Plain film of the pelvis shows mildly displaced left femoral neck fracture.  Will discuss with orthopedics.  Lactate initially elevated but  improved with IV fluids.  No leukocytosis, fever, or infection source.  Suspect that lactic acid is elevated from non-infection etiology.  No empiric antibiotics at this time.  Patient with hyponatremia and AKI.  Suspect dehydration clinically.  COVID negative.  CT imaging of the head with no acute findings or bleeds. Thiamine given. Unable to further assess gait with femoral neck fracture. Considered PE with persistent tachycardia but patient w/o CP and unable to obtain CTA with AKI. Will continue to monitor.   03:00 PM  Spoke with Dr. Ranell PatrickNorris with Ortho. [facility] and optimize for surgery tomorrow if possible. NPO after midnight.   Discussed patient's case with Hospitalist to request admission. Patient and family (if present) updated with plan. Care transferred to Hospitalist service.  I reviewed all nursing notes, vitals, pertinent old records, EKGs, labs, imaging (as available).  ____________________________________________  FINAL CLINICAL IMPRESSION(S) / ED DIAGNOSES  Final diagnoses:  Closed fracture of neck of left femur, initial encounter ([facility])  Frequent falls  AKI (acute kidney injury) ([facility])  Hyponatremia     MEDICATIONS GIVEN DURING THIS VISIT:  Medications  0.9 %  sodium chloride infusion ( Intravenous New Bag/Given 03/10/19 1607)  atorvastatin (LIPITOR) tablet 40 mg (has no administration in time range)  acetaminophen (TYLENOL) tablet 650 mg (has no administration in time range)    Or  acetaminophen  (TYLENOL) suppository 650 mg (has no administration in time range)  ondansetron (ZOFRAN) tablet 4 mg (has no administration in time range)    Or  ondansetron (ZOFRAN) injection 4 mg (has no administration in time range)  polyethylene glycol (MIRALAX / GLYCOLAX) packet 17 g (has no administration in time range)  LORazepam (ATIVAN) tablet 1 mg (has no administration in time range)    Or  LORazepam (ATIVAN) injection 1 mg (has no administration in time range)  thiamine (VITAMIN B-1) tablet 100 mg (has no administration in time range)    Or  thiamine (B-1) injection 100 mg (has no administration in time range)  folic acid (FOLVITE) tablet 1 mg (has no administration in time range)  multivitamin with minerals tablet 1 tablet (has no administration in time range)  morphine 2 MG/ML injection 2 mg (2 mg Intravenous Given 03/10/19 1703)  sodium chloride 0.9 % bolus 500 mL (0 mLs Intravenous Stopped 03/10/19 1158)  thiamine (B-1) injection 500 mg (500 mg Intravenous Given 03/10/19 1129)     Note:  This document was prepared using Dragon voice recognition software and may include unintentional dictation errors.  Alona BeneJoshua , MD Emergency Medicine    , Arlyss RepressJoshua G, MD 03/10/19 [phone]

## 2019-03-10 NOTE — ED Notes (Addendum)
Cardiac leads had come disconnected, leads replaced and hooked back up. Pt resting at this time with no complaints.

## 2019-03-10 NOTE — Consult Note (Signed)
Reason for Consult:left hip pain Referring Physician: Emokpae MD  Nathan Sellers is an 73 y.o. male.  HPI: 73 yo male presents in referral from [facility] for definitive treatment of a left displaced femoral neck fracture that occurred after a ground level fall. The patient denies any other pain and is unable to ambulate after the fall. XRAYs at [facility] show the displaced left hip fracture. He has medical issues which required transport to [place] for medical optimization.   Past Medical History:  Diagnosis Date  . COPD (chronic obstructive pulmonary disease) (HCC)   . Hypertension   . Renal disorder     History reviewed. No pertinent surgical history.  Family History  Problem Relation Age of Onset  . Hypertension Other   . CAD Mother   . Kidney disease Mother   . Alzheimer's disease Mother     Social History:  reports that he has quit smoking. He has never used smokeless tobacco. He reports current alcohol use of about 3.0 standard drinks of alcohol per week. No history on file for drug.  Allergies:  Allergies  Allergen Reactions  . Penicillins Hives    Medications: I have reviewed the patient's current medications.  Results for orders placed or performed during the hospital encounter of 03/10/19 (from the past 48 hour(s))  Comprehensive metabolic panel     Status: Abnormal   Collection Time: 03/10/19 11:15 AM  Result Value Ref Range   Sodium 121 (L) 135 - 145 mmol/L   Potassium 4.5 3.5 - 5.1 mmol/L   Chloride 89 (L) 98 - 111 mmol/L   CO2 16 (L) 22 - 32 mmol/L   Glucose, Bld 107 (H) 70 - 99 mg/dL   BUN 31 (H) 8 - 23 mg/dL   Creatinine, Ser 2.04 (H) 0.61 - 1.24 mg/dL   Calcium 8.2 (L) 8.9 - 10.3 mg/dL   Total Protein 6.8 6.5 - 8.1 g/dL   Albumin 3.4 (L) 3.5 - 5.0 g/dL   AST 28 15 - 41 U/L   ALT 18 0 - 44 U/L   Alkaline Phosphatase 77 38 - 126 U/L   Total Bilirubin 1.7 (H) 0.3 - 1.2 mg/dL   GFR calc non Af Amer 32 (L) >60 mL/min   GFR calc Af Amer 37 (L) >60 mL/min   Anion  gap 16 (H) 5 - 15    Comment: Performed at [facility], [address] [place], [place] [address]  Ethanol     Status: None   Collection Time: 03/10/19 11:15 AM  Result Value Ref Range   Alcohol, Ethyl (B) <10 <10 mg/dL    Comment: (NOTE) Lowest detectable limit for serum alcohol is 10 mg/dL. For medical purposes only. Performed at [facility], [address] [place], [place] [address]   Lipase, blood     Status: None   Collection Time: 03/10/19 11:15 AM  Result Value Ref Range   Lipase 33 11 - 51 U/L    Comment: Performed at [facility], [address] [place], [place] [address]  Salicylate level     Status: None   Collection Time: 03/10/19 11:15 AM  Result Value Ref Range   Salicylate Lvl <7.0 2.8 - 30.0 mg/dL    Comment: Performed at [facility], [address] [place], [place] [address]  Acetaminophen level     Status: Abnormal   Collection Time: 03/10/19 11:15 AM  Result Value Ref Range   Acetaminophen (Tylenol), Serum <10 (L) 10 - 30 ug/mL      Comment: (NOTE) Therapeutic concentrations vary significantly. A range of 10-30 ug/mL  may be an effective concentration for many patients. However, some  are best treated at concentrations outside of this range. Acetaminophen concentrations >150 ug/mL at 4 hours after ingestion  and >50 ug/mL at 12 hours after ingestion are often associated with  toxic reactions. Performed at [facility], [address] [place], [place] [address]   Troponin I (High Sensitivity)     Status: None   Collection Time: 03/10/19 11:15 AM  Result Value Ref Range   Troponin I (High Sensitivity) 14.00 <18 ng/L    Comment: (NOTE) Elevated high sensitivity troponin I (hsTnI) values and significant  changes across serial measurements may suggest ACS but many other  chronic and acute conditions are known to elevate hsTnI results.  Refer to the "Links" section for chest pain algorithms and additional  guidance. Performed at [facility], [address] [place], [place] [address]   Lactic acid, plasma     Status: Abnormal   Collection Time: 03/10/19 11:15 AM  Result Value Ref Range   Lactic Acid, Venous 2.3 (HH) 0.5 - 1.9 mmol/L    Comment: CRITICAL RESULT CALLED TO, READ BACK BY AND VERIFIED WITH: CRAWFORD H. @ 1144 ON @ [id number] BY HENDERSON L. Performed at [facility], [address] [place], [place] [address]   CBC with Differential     Status: Abnormal   Collection Time: 03/10/19 11:15 AM  Result Value Ref Range   WBC 8.5 4.0 - 10.5 K/uL   RBC 2.59 (L) 4.22 - 5.81 MIL/uL   Hemoglobin 9.7 (L) 13.0 - 17.0 g/dL   HCT 27.5 (L) 39.0 - 52.0 %   MCV 106.2 (H) 80.0 - 100.0 fL   MCH 37.5 (H) 26.0 - 34.0 pg   MCHC 35.3 30.0 - 36.0 g/dL   RDW 13.6 11.5 - 15.5 %   Platelets 193 150 - 400 K/uL   nRBC 0.0 0.0 - 0.2 %   Neutrophils Relative % 92 %   Neutro Abs 7.9 (H) 1.7 - 7.7 K/uL   Lymphocytes Relative 4 %   Lymphs Abs 0.3 (L) 0.7 - 4.0 K/uL   Monocytes Relative 3 %   Monocytes Absolute 0.2 0.1 - 1.0 K/uL   Eosinophils Relative 0 %   Eosinophils Absolute 0.0 0.0 - 0.5 K/uL   Basophils Relative 0 %   Basophils Absolute 0.0 0.0 - 0.1 K/uL   Immature Granulocytes 1 %   Abs Immature Granulocytes 0.10 (H) 0.00 - 0.07 K/uL    Comment: Performed at [facility], [address] [place], [place] [address]  Protime-INR     Status: None   Collection Time: 03/10/19 11:15 AM  Result Value Ref Range   Prothrombin Time 13.8 11.4 - 15.2 seconds   INR 1.1 0.8 - 1.2    Comment: (NOTE) INR goal varies based on device and disease states. Performed at [facility], [address] [place], [place] [address]   Ammonia     Status: None   Collection Time: 03/10/19 11:15 AM  Result Value Ref Range   Ammonia 18 9 - 35 umol/L    Comment: Performed at  Hospital, [address] [place], [place] [address]  TSH     Status: None   Collection Time: 03/10/19 11:18 AM  Result Value Ref Range   TSH 1.518 0.350 - 4.500 uIU/mL    Comment: Performed by a  3rd Generation assay with a functional sensitivity of <=0.01   uIU/mL. Performed at [facility], [address] [place], [place] [address]   SARS Coronavirus 2 (CEPHEID- Performed in [place] hospital lab), Hosp Order     Status: None   Collection Time: 03/10/19 11:31 AM   Specimen: Nasopharyngeal Swab  Result Value Ref Range   SARS Coronavirus 2 NEGATIVE NEGATIVE    Comment: (NOTE) If result is NEGATIVE SARS-CoV-2 target nucleic acids are NOT DETECTED. The SARS-CoV-2 RNA is generally detectable in upper and lower  respiratory specimens during the acute phase of infection. The lowest  concentration of SARS-CoV-2 viral copies this assay can detect is 250  copies / mL. A negative result does not preclude SARS-CoV-2 infection  and should not be used as the sole basis for treatment or other  patient management decisions.  A negative result may occur with  improper specimen collection / handling, submission of specimen other  than nasopharyngeal swab, presence of viral mutation(s) within the  areas targeted by this assay, and inadequate number of viral copies  (<250 copies / mL). A negative result must be combined with clinical  observations, patient history, and epidemiological information. If result is POSITIVE SARS-CoV-2 target nucleic acids are DETECTED. The SARS-CoV-2 RNA is generally detectable in upper and lower  respiratory specimens dur ing the acute phase of infection.  Positive  results are indicative of active infection with SARS-CoV-2.  Clinical  correlation with patient history and other diagnostic information is  necessary to determine patient infection status.  Positive results do  not rule out bacterial infection or co-infection with other viruses. If result is PRESUMPTIVE POSTIVE SARS-CoV-2 nucleic acids MAY BE PRESENT.   A presumptive positive result was obtained on the submitted specimen  and confirmed on repeat testing.  While 2019 novel coronavirus  (SARS-CoV-2)  nucleic acids may be present in the submitted sample  additional confirmatory testing may be necessary for epidemiological  and / or clinical management purposes  to differentiate between  SARS-CoV-2 and other Sarbecovirus currently known to infect humans.  If clinically indicated additional testing with an alternate test  methodology ([id number]) is advised. The SARS-CoV-2 RNA is generally  detectable in upper and lower respiratory sp ecimens during the acute  phase of infection. The expected result is Negative. Fact Sheet for Patients:  https://www.fda.gov/media/136312/download Fact Sheet for Healthcare Providers: https://www.fda.gov/media/136313/download This test is not yet approved or cleared by the [place] FDA and has been authorized for detection and/or diagnosis of SARS-CoV-2 by FDA under an Emergency Use Authorization (EUA).  This EUA will remain in effect (meaning this test can be used) for the duration of the COVID-19 declaration under Section 564(b)(1) of the Act, 21 U.S.C. section 360bbb-3(b)(1), unless the authorization is terminated or revoked sooner. Performed at [facility], [address] [place], [place] [address]   Lactic acid, plasma     Status: None   Collection Time: 03/10/19 12:44 PM  Result Value Ref Range   Lactic Acid, Venous 1.6 0.5 - 1.9 mmol/L    Comment: Performed at [facility], [address] , [place] [address]  Troponin I (High Sensitivity)     Status: None   Collection Time: 03/10/19 12:44 PM  Result Value Ref Range   Troponin I (High Sensitivity) 14.00 <18 ng/L    Comment: (NOTE) Elevated high sensitivity troponin I (hsTnI) values and significant  changes across serial measurements may suggest ACS but many other  chronic and acute conditions are known to elevate hsTnI results.  Refer to the "Links" section   for chest pain algorithms and additional  guidance. Performed at [facility], [address] [place], [place] [address]    Osmolality     Status: Abnormal   Collection Time: 03/10/19 12:44 PM  Result Value Ref Range   Osmolality 258 (L) 275 - 295 mOsm/kg    Comment: Performed at [facility], [address] [place], [place] [address]  Urinalysis, Routine w reflex microscopic     Status: Abnormal   Collection Time: 03/10/19  1:17 PM  Result Value Ref Range   Color, Urine YELLOW YELLOW   APPearance CLEAR CLEAR   Specific Gravity, Urine 1.012 1.005 - 1.030   pH 5.0 5.0 - 8.0   Glucose, UA NEGATIVE NEGATIVE mg/dL   Hgb urine dipstick MODERATE (A) NEGATIVE   Bilirubin Urine NEGATIVE NEGATIVE   Ketones, ur 5 (A) NEGATIVE mg/dL   Protein, ur NEGATIVE NEGATIVE mg/dL   Nitrite NEGATIVE NEGATIVE   Leukocytes,Ua NEGATIVE NEGATIVE   RBC / HPF 0-5 0 - 5 RBC/hpf   WBC, UA 6-10 0 - 5 WBC/hpf   Bacteria, UA NONE SEEN NONE SEEN   Squamous Epithelial / LPF 0-5 0 - 5   Mucus PRESENT     Comment: Performed at [facility], [address] [place], [place] [address]  Urine rapid drug screen (hosp performed)     Status: None   Collection Time: 03/10/19  1:17 PM  Result Value Ref Range   Opiates NONE DETECTED NONE DETECTED   Cocaine NONE DETECTED NONE DETECTED   Benzodiazepines NONE DETECTED NONE DETECTED   Amphetamines NONE DETECTED NONE DETECTED   Tetrahydrocannabinol NONE DETECTED NONE DETECTED   Barbiturates NONE DETECTED NONE DETECTED    Comment: (NOTE) DRUG SCREEN FOR MEDICAL PURPOSES ONLY.  IF CONFIRMATION IS NEEDED FOR ANY PURPOSE, NOTIFY LAB WITHIN 5 DAYS. LOWEST DETECTABLE LIMITS FOR URINE DRUG SCREEN Drug Class                     Cutoff (ng/mL) Amphetamine and metabolites    1000 Barbiturate and metabolites    200 Benzodiazepine                 200 Tricyclics and metabolites     300 Opiates and metabolites        300 Cocaine and metabolites        300 THC                            50 Performed at [facility], [address] [place], [place] [address]   Sodium, urine, random     Status:  None   Collection Time: 03/10/19  1:17 PM  Result Value Ref Range   Sodium, Ur 31 mmol/L    Comment: Performed at [facility], [address] [place], [place] [address]  Osmolality, urine     Status: None   Collection Time: 03/10/19  1:19 PM  Result Value Ref Range   Osmolality, Ur 377 300 - 900 mOsm/kg    Comment: Performed at [facility], [address] , [place] [address]  Blood gas, arterial     Status: Abnormal   Collection Time: 03/10/19  4:20 PM  Result Value Ref Range   FIO2 28.00    pH, Arterial 7.472 (H) 7.350 - 7.450   pCO2 arterial 20.7 (L) 32.0 - 48.0 mmHg   pO2, Arterial 52.3 (L) 83.0 - 108.0 mmHg     Bicarbonate 18.3 (L) 20.0 - 28.0 mmol/L   Acid-base deficit 8.1 (H) 0.0 - 2.0 mmol/L   O2 Saturation 84.3 %   Patient temperature 37.0    Allens test (pass/fail) PASS PASS    Comment: Performed at [facility], [address] [place], [place] [address]  Basic metabolic panel     Status: Abnormal   Collection Time: 03/10/19  4:46 PM  Result Value Ref Range   Sodium 122 (L) 135 - 145 mmol/L   Potassium 4.3 3.5 - 5.1 mmol/L   Chloride 93 (L) 98 - 111 mmol/L   CO2 16 (L) 22 - 32 mmol/L   Glucose, Bld 90 70 - 99 mg/dL   BUN 33 (H) 8 - 23 mg/dL   Creatinine, Ser 1.80 (H) 0.61 - 1.24 mg/dL   Calcium 7.7 (L) 8.9 - 10.3 mg/dL   GFR calc non Af Amer 37 (L) >60 mL/min   GFR calc Af Amer 43 (L) >60 mL/min   Anion gap 13 5 - 15    Comment: Performed at [facility], [address] [place], [place] [address]  Magnesium     Status: Abnormal   Collection Time: 03/10/19  4:46 PM  Result Value Ref Range   Magnesium 1.5 (L) 1.7 - 2.4 mg/dL    Comment: Performed at [facility], [address] [place], [place] [address]  Phosphorus     Status: None   Collection Time: 03/10/19  4:46 PM  Result Value Ref Range   Phosphorus 3.4 2.5 - 4.6 mg/dL    Comment: Performed at [facility], [address] [place], [place] [address]    Dg Pelvis 1-2 Views  Result Date:  03/10/2019 CLINICAL DATA:  Fall yesterday. Pelvis and left hip pain. EXAM: PELVIS - 1-2 VIEW COMPARISON:  None. FINDINGS: A mildly displaced left femoral neck fracture is seen. No evidence of hip dislocation. No evidence of pelvic fracture. Extensive atherosclerotic calcification involving the iliac arteries. IMPRESSION: Mildly displaced left femoral neck fracture. Electronically Signed   By: Hykeem A Stahl M.D.   On: 03/10/2019 14:23   Dg Forearm Left  Result Date: 03/10/2019 CLINICAL DATA:  Fall yesterday. Left forearm pain and bruising. Initial encounter. EXAM: LEFT FOREARM - 2 VIEW COMPARISON:  None. FINDINGS: There is no evidence of fracture or other focal bone lesions. Moderate soft tissue swelling is seen involving the distal forearm. Mild peripheral vascular calcification also seen. IMPRESSION: Distal forearm soft tissue swelling. No evidence of fracture. Electronically Signed   By: Rush A Stahl M.D.   On: 03/10/2019 14:21   Ct Head Wo Contrast  Result Date: 03/10/2019 CLINICAL DATA:  Recent fall. Minor blunt head trauma. Initial encounter. EXAM: CT HEAD WITHOUT CONTRAST TECHNIQUE: Contiguous axial images were obtained from the base of the skull through the vertex without intravenous contrast. COMPARISON:  None. FINDINGS: Brain: No evidence of acute infarction, hemorrhage, hydrocephalus, extra-axial collection, or mass lesion/mass effect. Mild diffuse cerebral atrophy and chronic small vessel disease. Vascular:  No hyperdense vessel or other acute findings. Skull: No evidence of fracture or other significant bone abnormality. Sinuses/Orbits:  No acute findings. Other: None. IMPRESSION: No acute intracranial abnormality. Mild cerebral atrophy and chronic small vessel disease. Electronically Signed   By: Dream A Stahl M.D.   On: 03/10/2019 14:07   Dg Chest Portable 1 View  Result Date: 03/10/2019 CLINICAL DATA:  Shortness of breath. Dyspnea. COPD. Recent falls. EXAM: PORTABLE CHEST 1 VIEW COMPARISON:   07/25/2018 FINDINGS: The heart size and mediastinal contours   are within normal limits. Aortic atherosclerosis. No evidence of acute pulmonary infiltrate or edema. No evidence of pleural effusion. A small nodular density is seen in right lower lobe, as seen on recent PET-CT. The visualized skeletal structures are unremarkable. IMPRESSION: 1. No acute findings. 2. Right lower lobe pulmonary nodule, as seen on recent PET-CT. Electronically Signed   By: Lenin A Stahl M.D.   On: 03/10/2019 14:20    ROS Blood pressure (!) 142/90, pulse (!) 111, temperature 97.7 F (36.5 C), temperature source Oral, resp. rate 20, height 5' 7" (1.702 m), weight 59 kg, SpO2 95 %. Physical Exam Patient is hard of hearing but  Alert and awake and responsive to commands. Neck and back nontender with no deformity. Bilateral shoulders with normal AROM no deformity and no tenderness,  He does have some impressive bruising in the left arm. Chest with normal excursion and abdomen soft and nontender. Right LE with pain free full AROM Left LE : pain with PROM of the left hip. The left leg is shortened and externally rotated. Distally normal pulses and pain free AROM of the ankle. The knee is non swollen and non tender.  Assessment/Plan: Left displaced femoral neck fracture.  Medical optimization I have spoken with Dr Swinteck who plans to add the patient on to surgery for an arthroplasty tomorrow PM.  NPO after MN.   Nathan Sellers 03/10/2019, 8:53 PM     

## 2019-03-10 NOTE — H&P (View-Only) (Signed)
Reason for Consult:left hip pain Referring Physician: Denton Brick MD  Nathan Sellers is an 73 y.o. male.  HPI: 73 yo male presents in referral from [facility] for definitive treatment of a left displaced femoral neck fracture that occurred after a ground level fall. The patient denies any other pain and is unable to ambulate after the fall. XRAYs at [facility] show the displaced left hip fracture. He has medical issues which required transport to [facility] for medical optimization.   Past Medical History:  Diagnosis Date  . COPD (chronic obstructive pulmonary disease) ([place])   . Hypertension   . Renal disorder     History reviewed. No pertinent surgical history.  Family History  Problem Relation Age of Onset  . Hypertension Other   . CAD Mother   . Kidney disease Mother   . Alzheimer's disease Mother     Social History:  reports that he has quit smoking. He has never used smokeless tobacco. He reports current alcohol use of about 3.0 standard drinks of alcohol per week. No history on file for drug.  Allergies:  Allergies  Allergen Reactions  . Penicillins Hives    Medications: I have reviewed the patient's current medications.  Results for orders placed or performed during the hospital encounter of 03/10/19 (from the past 48 hour(s))  Comprehensive metabolic panel     Status: Abnormal   Collection Time: 03/10/19 11:15 AM  Result Value Ref Range   Sodium 121 (L) 135 - 145 mmol/L   Potassium 4.5 3.5 - 5.1 mmol/L   Chloride 89 (L) 98 - 111 mmol/L   CO2 16 (L) 22 - 32 mmol/L   Glucose, Bld 107 (H) 70 - 99 mg/dL   BUN 31 (H) 8 - 23 mg/dL   Creatinine, Ser 2.04 (H) 0.61 - 1.24 mg/dL   Calcium 8.2 (L) 8.9 - 10.3 mg/dL   Total Protein 6.8 6.5 - 8.1 g/dL   Albumin 3.4 (L) 3.5 - 5.0 g/dL   AST 28 15 - 41 U/L   ALT 18 0 - 44 U/L   Alkaline Phosphatase 77 38 - 126 U/L   Total Bilirubin 1.7 (H) 0.3 - 1.2 mg/dL   GFR calc non Af Amer 32 (L) >60 mL/min   GFR calc Af Amer 37 (L) >60 mL/min   Anion  gap 16 (H) 5 - 15    Comment: Performed at [facility], [address] [place], [place] [address]  Ethanol     Status: None   Collection Time: 03/10/19 11:15 AM  Result Value Ref Range   Alcohol, Ethyl (B) <10 <10 mg/dL    Comment: (NOTE) Lowest detectable limit for serum alcohol is 10 mg/dL. For medical purposes only. Performed at [facility], [address] [place], [place] [address]   Lipase, blood     Status: None   Collection Time: 03/10/19 11:15 AM  Result Value Ref Range   Lipase 33 11 - 51 U/L    Comment: Performed at [facility], [address], [place], [place] [address]  Salicylate level     Status: None   Collection Time: 03/10/19 11:15 AM  Result Value Ref Range   Salicylate Lvl <3.5 2.8 - 30.0 mg/dL    Comment: Performed at [facility], [address], [place], [place] [address]  Acetaminophen level     Status: Abnormal   Collection Time: 03/10/19 11:15 AM  Result Value Ref Range   Acetaminophen (Tylenol), Serum <10 (L) 10 - 30 ug/mL  Comment: (NOTE) Therapeutic concentrations vary significantly. A range of 10-30 ug/mL  may be an effective concentration for many patients. However, some  are best treated at concentrations outside of this range. Acetaminophen concentrations >150 ug/mL at 4 hours after ingestion  and >50 ug/mL at 12 hours after ingestion are often associated with  toxic reactions. Performed at [facility], [address] [place], [place] [address]   Troponin I (High Sensitivity)     Status: None   Collection Time: 03/10/19 11:15 AM  Result Value Ref Range   Troponin I (High Sensitivity) 14.00 <18 ng/L    Comment: (NOTE) Elevated high sensitivity troponin I (hsTnI) values and significant  changes across serial measurements may suggest ACS but many other  chronic and acute conditions are known to elevate hsTnI results.  Refer to the "Links" section for chest pain algorithms and additional  guidance. Performed at [facility], [address] [place], [place] [address]   Lactic acid, plasma     Status: Abnormal   Collection Time: 03/10/19 11:15 AM  Result Value Ref Range   Lactic Acid, Venous 2.3 (HH) 0.5 - 1.9 mmol/L    Comment: CRITICAL RESULT CALLED TO, READ BACK BY AND VERIFIED WITH: CRAWFORD H. @ 1144 ON @ [id number] BY HENDERSON L. Performed at [facility], [address] [place], [place] [address]   CBC with Differential     Status: Abnormal   Collection Time: 03/10/19 11:15 AM  Result Value Ref Range   WBC 8.5 4.0 - 10.5 K/uL   RBC 2.59 (L) 4.22 - 5.81 MIL/uL   Hemoglobin 9.7 (L) 13.0 - 17.0 g/dL   HCT 96.227.5 (L) 95.239.0 - 84.152.0 %   MCV 106.2 (H) 80.0 - 100.0 fL   MCH 37.5 (H) 26.0 - 34.0 pg   MCHC 35.3 30.0 - 36.0 g/dL   RDW 32.413.6 40.111.5 - 02.715.5 %   Platelets 193 150 - 400 K/uL   nRBC 0.0 0.0 - 0.2 %   Neutrophils Relative % 92 %   Neutro Abs 7.9 (H) 1.7 - 7.7 K/uL   Lymphocytes Relative 4 %   Lymphs Abs 0.3 (L) 0.7 - 4.0 K/uL   Monocytes Relative 3 %   Monocytes Absolute 0.2 0.1 - 1.0 K/uL   Eosinophils Relative 0 %   Eosinophils Absolute 0.0 0.0 - 0.5 K/uL   Basophils Relative 0 %   Basophils Absolute 0.0 0.0 - 0.1 K/uL   Immature Granulocytes 1 %   Abs Immature Granulocytes 0.10 (H) 0.00 - 0.07 K/uL    Comment: Performed at [facility], [address] [place], [place] [address]  Protime-INR     Status: None   Collection Time: 03/10/19 11:15 AM  Result Value Ref Range   Prothrombin Time 13.8 11.4 - 15.2 seconds   INR 1.1 0.8 - 1.2    Comment: (NOTE) INR goal varies based on device and disease states. Performed at [facility], [address] [place], [place] [address]   Ammonia     Status: None   Collection Time: 03/10/19 11:15 AM  Result Value Ref Range   Ammonia 18 9 - 35 umol/L    Comment: Performed at [facility], [address] [place], [place] [address]  TSH     Status: None   Collection Time: 03/10/19 11:18 AM  Result Value Ref Range   TSH 1.518 0.350 - 4.500 uIU/mL    Comment: Performed by a  3rd Generation assay with a functional sensitivity of <=0.01  uIU/mL. Performed at [facility], [address] [place], [place] [address]   SARS Coronavirus 2 (CEPHEID- Performed in [facility] hospital lab), Hosp Order     Status: None   Collection Time: 03/10/19 11:31 AM   Specimen: Nasopharyngeal Swab  Result Value Ref Range   SARS Coronavirus 2 NEGATIVE NEGATIVE    Comment: (NOTE) If result is NEGATIVE SARS-CoV-2 target nucleic acids are NOT DETECTED. The SARS-CoV-2 RNA is generally detectable in upper and lower  respiratory specimens during the acute phase of infection. The lowest  concentration of SARS-CoV-2 viral copies this assay can detect is 250  copies / mL. A negative result does not preclude SARS-CoV-2 infection  and should not be used as the sole basis for treatment or other  patient management decisions.  A negative result may occur with  improper specimen collection / handling, submission of specimen other  than nasopharyngeal swab, presence of viral mutation(s) within the  areas targeted by this assay, and inadequate number of viral copies  (<250 copies / mL). A negative result must be combined with clinical  observations, patient history, and epidemiological information. If result is POSITIVE SARS-CoV-2 target nucleic acids are DETECTED. The SARS-CoV-2 RNA is generally detectable in upper and lower  respiratory specimens dur ing the acute phase of infection.  Positive  results are indicative of active infection with SARS-CoV-2.  Clinical  correlation with patient history and other diagnostic information is  necessary to determine patient infection status.  Positive results do  not rule out bacterial infection or co-infection with other viruses. If result is PRESUMPTIVE POSTIVE SARS-CoV-2 nucleic acids MAY BE PRESENT.   A presumptive positive result was obtained on the submitted specimen  and confirmed on repeat testing.  While 2019 novel coronavirus  (SARS-CoV-2)  nucleic acids may be present in the submitted sample  additional confirmatory testing may be necessary for epidemiological  and / or clinical management purposes  to differentiate between  SARS-CoV-2 and other Sarbecovirus currently known to infect humans.  If clinically indicated additional testing with an alternate test  methodology [phone] is advised. The SARS-CoV-2 RNA is generally  detectable in upper and lower respiratory sp ecimens during the acute  phase of infection. The expected result is Negative. Fact Sheet for Patients:  BoilerBrush.com.cyhttps://www.fda.gov/media/136312/download Fact Sheet for Healthcare Providers: https://pope.com/https://www.fda.gov/media/136313/download This test is not yet approved or cleared by the [place] FDA and has been authorized for detection and/or diagnosis of SARS-CoV-2 by FDA under an Emergency Use Authorization (EUA).  This EUA will remain in effect (meaning this test can be used) for the duration of the COVID-19 declaration under Section 564(b)(1) of the Act, 21 U.S.C. section 360bbb-3(b)(1), unless the authorization is terminated or revoked sooner. Performed at [facility], [address] [place], [place] [address]   Lactic acid, plasma     Status: None   Collection Time: 03/10/19 12:44 PM  Result Value Ref Range   Lactic Acid, Venous 1.6 0.5 - 1.9 mmol/L    Comment: Performed at [facility], [address] [place], [place] [address]  Troponin I (High Sensitivity)     Status: None   Collection Time: 03/10/19 12:44 PM  Result Value Ref Range   Troponin I (High Sensitivity) 14.00 <18 ng/L    Comment: (NOTE) Elevated high sensitivity troponin I (hsTnI) values and significant  changes across serial measurements may suggest ACS but many other  chronic and acute conditions are known to elevate hsTnI results.  Refer to the "Links" section  for chest pain algorithms and additional  guidance. Performed at [facility], [address] [place], [place] [address]    Osmolality     Status: Abnormal   Collection Time: 03/10/19 12:44 PM  Result Value Ref Range   Osmolality 258 (L) 275 - 295 mOsm/kg    Comment: Performed at [facility], [address], [place], [place] [address]  Urinalysis, Routine w reflex microscopic     Status: Abnormal   Collection Time: 03/10/19  1:17 PM  Result Value Ref Range   Color, Urine YELLOW YELLOW   APPearance CLEAR CLEAR   Specific Gravity, Urine 1.012 1.005 - 1.030   pH 5.0 5.0 - 8.0   Glucose, UA NEGATIVE NEGATIVE mg/dL   Hgb urine dipstick MODERATE (A) NEGATIVE   Bilirubin Urine NEGATIVE NEGATIVE   Ketones, ur 5 (A) NEGATIVE mg/dL   Protein, ur NEGATIVE NEGATIVE mg/dL   Nitrite NEGATIVE NEGATIVE   Leukocytes,Ua NEGATIVE NEGATIVE   RBC / HPF 0-5 0 - 5 RBC/hpf   WBC, UA 6-10 0 - 5 WBC/hpf   Bacteria, UA NONE SEEN NONE SEEN   Squamous Epithelial / LPF 0-5 0 - 5   Mucus PRESENT     Comment: Performed at [facility], [address], [place], [place] [address]  Urine rapid drug screen (hosp performed)     Status: None   Collection Time: 03/10/19  1:17 PM  Result Value Ref Range   Opiates NONE DETECTED NONE DETECTED   Cocaine NONE DETECTED NONE DETECTED   Benzodiazepines NONE DETECTED NONE DETECTED   Amphetamines NONE DETECTED NONE DETECTED   Tetrahydrocannabinol NONE DETECTED NONE DETECTED   Barbiturates NONE DETECTED NONE DETECTED    Comment: (NOTE) DRUG SCREEN FOR MEDICAL PURPOSES ONLY.  IF CONFIRMATION IS NEEDED FOR ANY PURPOSE, NOTIFY LAB WITHIN 5 DAYS. LOWEST DETECTABLE LIMITS FOR URINE DRUG SCREEN Drug Class                     Cutoff (ng/mL) Amphetamine and metabolites    1000 Barbiturate and metabolites    200 Benzodiazepine                 200 Tricyclics and metabolites     300 Opiates and metabolites        300 Cocaine and metabolites        300 THC                            50 Performed at [facility], [address], [place], [place] [address]   Sodium, urine, random     Status:  None   Collection Time: 03/10/19  1:17 PM  Result Value Ref Range   Sodium, Ur 31 mmol/L    Comment: Performed at [facility], [address], [place], [place] [address]  Osmolality, urine     Status: None   Collection Time: 03/10/19  1:19 PM  Result Value Ref Range   Osmolality, Ur 377 300 - 900 mOsm/kg    Comment: Performed at [facility], [address] [place], [place] [address]  Blood gas, arterial     Status: Abnormal   Collection Time: 03/10/19  4:20 PM  Result Value Ref Range   FIO2 28.00    pH, Arterial 7.472 (H) 7.350 - 7.450   pCO2 arterial 20.7 (L) 32.0 - 48.0 mmHg   pO2, Arterial 52.3 (L) 83.0 - 108.0 mmHg  Bicarbonate 18.3 (L) 20.0 - 28.0 mmol/L   Acid-base deficit 8.1 (H) 0.0 - 2.0 mmol/L   O2 Saturation 84.3 %   Patient temperature 37.0    Allens test (pass/fail) PASS PASS    Comment: Performed at [facility], [address] [place], [place] [address]  Basic metabolic panel     Status: Abnormal   Collection Time: 03/10/19  4:46 PM  Result Value Ref Range   Sodium 122 (L) 135 - 145 mmol/L   Potassium 4.3 3.5 - 5.1 mmol/L   Chloride 93 (L) 98 - 111 mmol/L   CO2 16 (L) 22 - 32 mmol/L   Glucose, Bld 90 70 - 99 mg/dL   BUN 33 (H) 8 - 23 mg/dL   Creatinine, Ser 1.911.80 (H) 0.61 - 1.24 mg/dL   Calcium 7.7 (L) 8.9 - 10.3 mg/dL   GFR calc non Af Amer 37 (L) >60 mL/min   GFR calc Af Amer 43 (L) >60 mL/min   Anion gap 13 5 - 15    Comment: Performed at [facility], [address] [place], [place] [address]  Magnesium     Status: Abnormal   Collection Time: 03/10/19  4:46 PM  Result Value Ref Range   Magnesium 1.5 (L) 1.7 - 2.4 mg/dL    Comment: Performed at [facility], [address] [place], [place] [address]  Phosphorus     Status: None   Collection Time: 03/10/19  4:46 PM  Result Value Ref Range   Phosphorus 3.4 2.5 - 4.6 mg/dL    Comment: Performed at [facility], [address] [place], [place] [address]    Dg Pelvis 1-2 Views  Result Date:  03/10/2019 CLINICAL DATA:  Fall yesterday. Pelvis and left hip pain. EXAM: PELVIS - 1-2 VIEW COMPARISON:  None. FINDINGS: A mildly displaced left femoral neck fracture is seen. No evidence of hip dislocation. No evidence of pelvic fracture. Extensive atherosclerotic calcification involving the iliac arteries. IMPRESSION: Mildly displaced left femoral neck fracture. Electronically Signed   By: Danae OrleansJohn A Stahl M.D.   On: 03/10/2019 14:23   Dg Forearm Left  Result Date: 03/10/2019 CLINICAL DATA:  Fall yesterday. Left forearm pain and bruising. Initial encounter. EXAM: LEFT FOREARM - 2 VIEW COMPARISON:  None. FINDINGS: There is no evidence of fracture or other focal bone lesions. Moderate soft tissue swelling is seen involving the distal forearm. Mild peripheral vascular calcification also seen. IMPRESSION: Distal forearm soft tissue swelling. No evidence of fracture. Electronically Signed   By: Danae OrleansJohn A Stahl M.D.   On: 03/10/2019 14:21   Ct Head Wo Contrast  Result Date: 03/10/2019 CLINICAL DATA:  Recent fall. Minor blunt head trauma. Initial encounter. EXAM: CT HEAD WITHOUT CONTRAST TECHNIQUE: Contiguous axial images were obtained from the base of the skull through the vertex without intravenous contrast. COMPARISON:  None. FINDINGS: Brain: No evidence of acute infarction, hemorrhage, hydrocephalus, extra-axial collection, or mass lesion/mass effect. Mild diffuse cerebral atrophy and chronic small vessel disease. Vascular:  No hyperdense vessel or other acute findings. Skull: No evidence of fracture or other significant bone abnormality. Sinuses/Orbits:  No acute findings. Other: None. IMPRESSION: No acute intracranial abnormality. Mild cerebral atrophy and chronic small vessel disease. Electronically Signed   By: Danae OrleansJohn A Stahl M.D.   On: 03/10/2019 14:07   Dg Chest Portable 1 View  Result Date: 03/10/2019 CLINICAL DATA:  Shortness of breath. Dyspnea. COPD. Recent falls. EXAM: PORTABLE CHEST 1 VIEW COMPARISON:   07/25/2018 FINDINGS: The heart size and mediastinal contours  are within normal limits. Aortic atherosclerosis. No evidence of acute pulmonary infiltrate or edema. No evidence of pleural effusion. A small nodular density is seen in right lower lobe, as seen on recent PET-CT. The visualized skeletal structures are unremarkable. IMPRESSION: 1. No acute findings. 2. Right lower lobe pulmonary nodule, as seen on recent PET-CT. Electronically Signed   By: Danae Orleans M.D.   On: 03/10/2019 14:20    ROS Blood pressure (!) 142/90, pulse (!) 111, temperature 97.7 F (36.5 C), temperature source Oral, resp. rate 20, height  (1.702 m), weight 59 kg, SpO2 95 %. Physical Exam Patient is hard of hearing but  Alert and awake and responsive to commands. Neck and back nontender with no deformity. Bilateral shoulders with normal AROM no deformity and no tenderness,  He does have some impressive bruising in the left arm. Chest with normal excursion and abdomen soft and nontender. Right LE with pain free full AROM Left LE : pain with PROM of the left hip. The left leg is shortened and externally rotated. Distally normal pulses and pain free AROM of the ankle. The knee is non swollen and non tender.  Assessment/Plan: Left displaced femoral neck fracture.  Medical optimization I have spoken with Dr Linna Caprice who plans to add the patient on to surgery for an arthroplasty tomorrow PM.  NPO after MN.   Verlee Rossetti 03/10/2019, 8:53 PM

## 2019-03-11 ENCOUNTER — Inpatient Hospital Stay (HOSPITAL_COMMUNITY): Payer: Medicare HMO

## 2019-03-11 ENCOUNTER — Encounter (HOSPITAL_COMMUNITY)
Admission: EM | Disposition: A | Discharge status: Home Health | Payer: Self-pay | Source: Home / Self Care | Attending: Family Medicine

## 2019-03-11 ENCOUNTER — Inpatient Hospital Stay (HOSPITAL_COMMUNITY): Payer: Medicare HMO | Admitting: Certified Registered Nurse Anesthetist

## 2019-03-11 ENCOUNTER — Encounter (HOSPITAL_COMMUNITY): Payer: Self-pay | Admitting: Certified Registered Nurse Anesthetist

## 2019-03-11 DIAGNOSIS — F1020 Alcohol dependence, uncomplicated: Secondary | ICD-10-CM | POA: Diagnosis present

## 2019-03-11 DIAGNOSIS — N183 Chronic kidney disease, stage 3 unspecified: Secondary | ICD-10-CM | POA: Diagnosis present

## 2019-03-11 DIAGNOSIS — N179 Acute kidney failure, unspecified: Secondary | ICD-10-CM | POA: Diagnosis present

## 2019-03-11 DIAGNOSIS — S72002A Fracture of unspecified part of neck of left femur, initial encounter for closed fracture: Secondary | ICD-10-CM | POA: Diagnosis present

## 2019-03-11 DIAGNOSIS — R296 Repeated falls: Secondary | ICD-10-CM

## 2019-03-11 DIAGNOSIS — I10 Essential (primary) hypertension: Secondary | ICD-10-CM | POA: Diagnosis present

## 2019-03-11 HISTORY — PX: TOTAL HIP ARTHROPLASTY: SHX124

## 2019-03-11 LAB — BASIC METABOLIC PANEL
Anion gap: 11 (ref 5–15)
Anion gap: 14 (ref 5–15)
BUN: 29 mg/dL — ABNORMAL HIGH (ref 8–23)
BUN: 28 mg/dL — ABNORMAL HIGH (ref 8–23)
CO2: 16 mmol/L — ABNORMAL LOW (ref 22–32)
CO2: 15 mmol/L — ABNORMAL LOW (ref 22–32)
Calcium: 7.9 mg/dL — ABNORMAL LOW (ref 8.9–10.3)
Calcium: 8 mg/dL — ABNORMAL LOW (ref 8.9–10.3)
Chloride: 97 mmol/L — ABNORMAL LOW (ref 98–111)
Chloride: 97 mmol/L — ABNORMAL LOW (ref 98–111)
Creatinine, Ser: 1.74 mg/dL — ABNORMAL HIGH (ref 0.61–1.24)
Creatinine, Ser: 1.7 mg/dL — ABNORMAL HIGH (ref 0.61–1.24)
GFR calc Af Amer: 44 mL/min — ABNORMAL LOW (ref >60)
GFR calc Af Amer: 46 mL/min — ABNORMAL LOW (ref >60)
GFR calc non Af Amer: 38 mL/min — ABNORMAL LOW (ref >60)
GFR calc non Af Amer: 39 mL/min — ABNORMAL LOW (ref >60)
Glucose, Bld: 80 mg/dL (ref 70–99)
Glucose, Bld: 82 mg/dL (ref 70–99)
Potassium: 4.1 mmol/L (ref 3.5–5.1)
Potassium: 3.8 mmol/L (ref 3.5–5.1)
Sodium: 124 mmol/L — ABNORMAL LOW (ref 135–145)
Sodium: 126 mmol/L — ABNORMAL LOW (ref 135–145)

## 2019-03-11 LAB — CBC
HCT: 24.8 % — ABNORMAL LOW (ref 39.0–52.0)
Hemoglobin: 8.7 g/dL — ABNORMAL LOW (ref 13.0–17.0)
MCH: 37 pg — ABNORMAL HIGH (ref 26.0–34.0)
MCHC: 35.1 g/dL (ref 30.0–36.0)
MCV: 105.5 fL — ABNORMAL HIGH (ref 80.0–100.0)
Platelets: 174 10*3/uL (ref 150–400)
RBC: 2.35 MIL/uL — ABNORMAL LOW (ref 4.22–5.81)
RDW: 13.6 % (ref 11.5–15.5)
WBC: 5.6 10*3/uL (ref 4.0–10.5)
nRBC: 0 % (ref 0.0–0.2)

## 2019-03-11 LAB — IRON AND TIBC
Iron: 47 ug/dL (ref 45–182)
Saturation Ratios: 19 % (ref 17.9–39.5)
TIBC: 249 ug/dL — ABNORMAL LOW (ref 250–450)
UIBC: 202 ug/dL

## 2019-03-11 LAB — MAGNESIUM: Magnesium: 2.1 mg/dL (ref 1.7–2.4)

## 2019-03-11 LAB — PREPARE RBC (CROSSMATCH)

## 2019-03-11 LAB — FERRITIN: Ferritin: 318 ng/mL (ref 24–336)

## 2019-03-11 LAB — VITAMIN B12: Vitamin B-12: 76 pg/mL — ABNORMAL LOW (ref 180–914)

## 2019-03-11 LAB — SURGICAL PCR SCREEN
MRSA, PCR: NEGATIVE
Staphylococcus aureus: NEGATIVE

## 2019-03-11 IMAGING — DX PORTABLE PELVIS 1-2 VIEWS
1 series · 1 of 1 positions shown · non-contrast
Comparison: 03/10/2019

CLINICAL DATA: Status post total left hip replacement.

EXAM:
PORTABLE PELVIS 1-2 VIEWS

[pelvis]
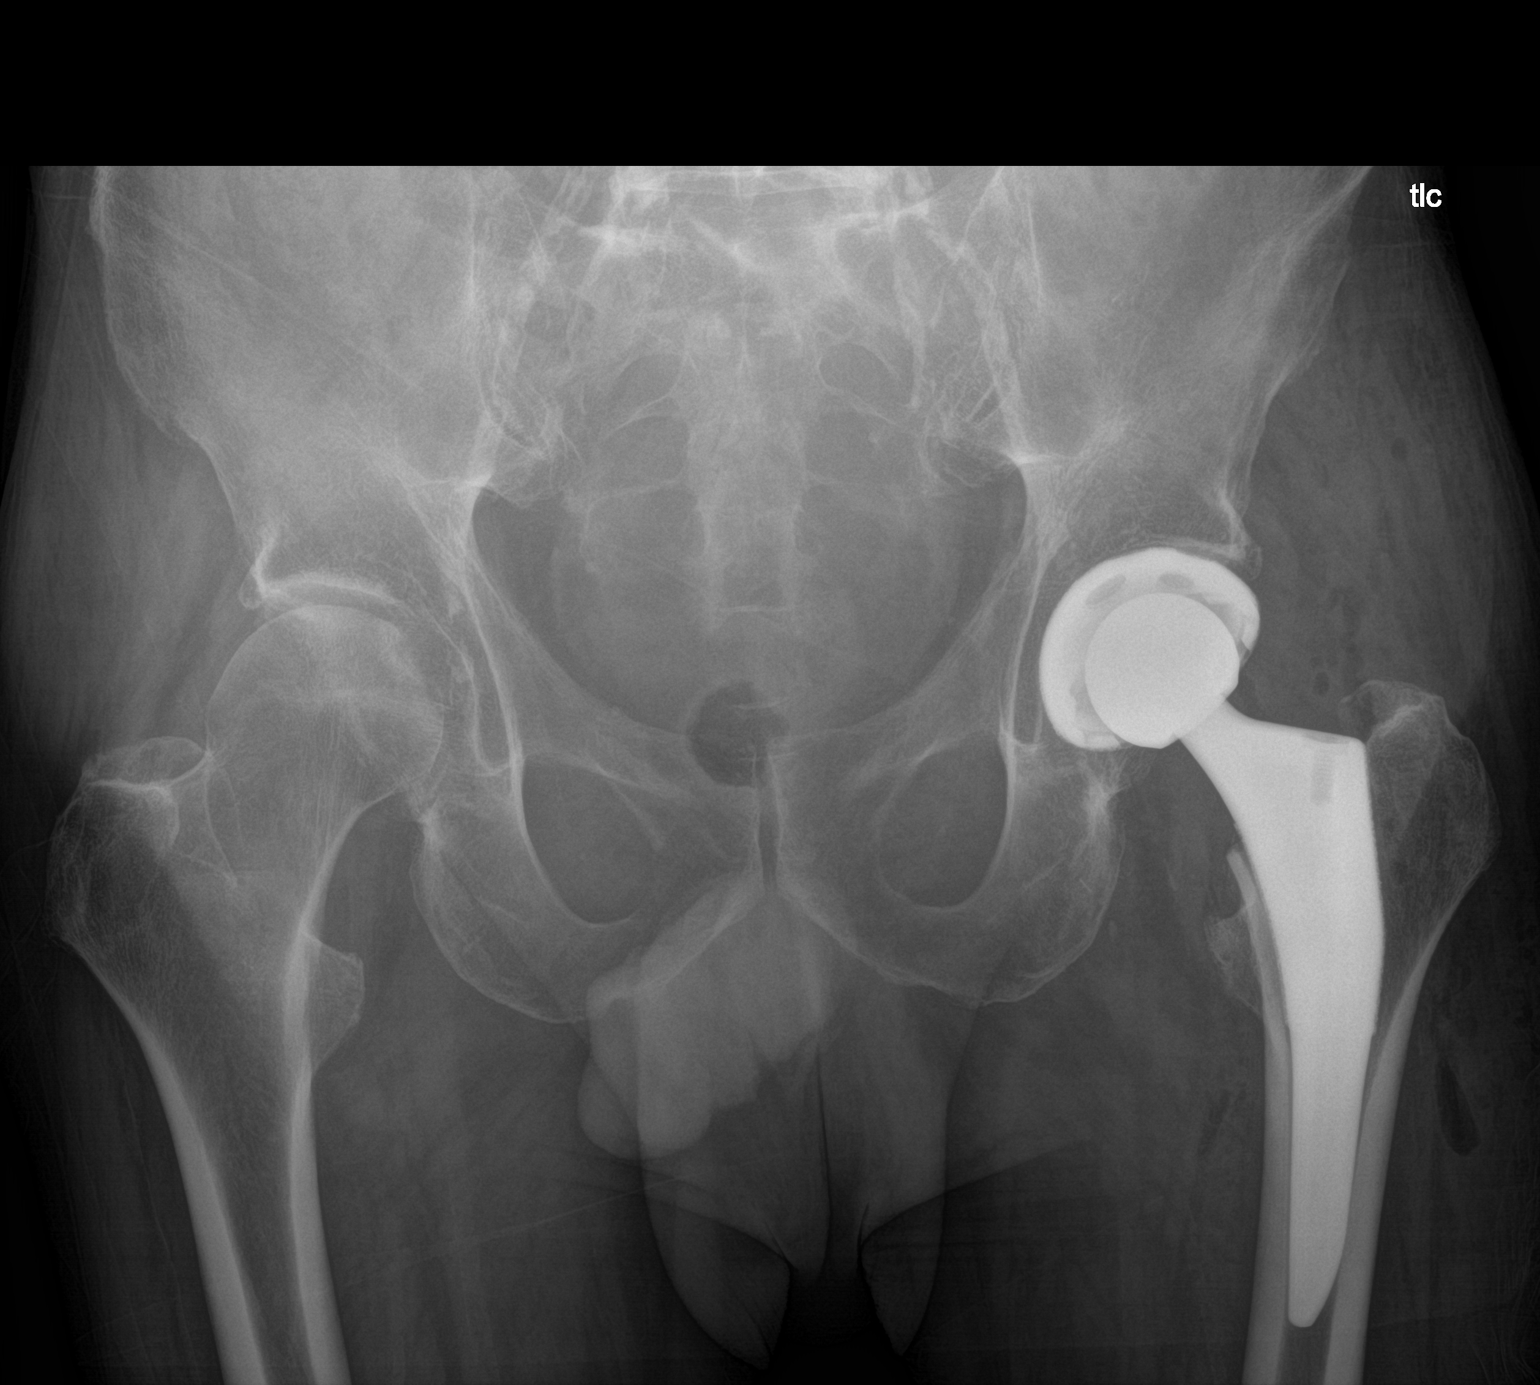

[1 of 1 positions shown; findings below may reference images not displayed]

FINDINGS: Post recent left hip arthroplasty. Normal alignment of the hardware
components. Expected soft tissue emphysema and edema.
IMPRESSION: Post recent left hip arthroplasty without evidence of immediate
complications.

## 2019-03-11 IMAGING — RF OPERATIVE LEFT HIP WITH PELVIS
1 series · 3 of 3 positions shown · non-contrast
Comparison: 03/10/2019

CLINICAL DATA: LEFT hip arthroplasty

EXAM:
OPERATIVE LEFT HIP (WITH PELVIS IF PERFORMED) 3 VIEWS
TECHNIQUE: Fluoroscopic spot image(s) were submitted for interpretation
post-operatively.

[Series 1: unknown protocol · 0.20mm/px · 3 of 3 slices shown]
[im 1/3]
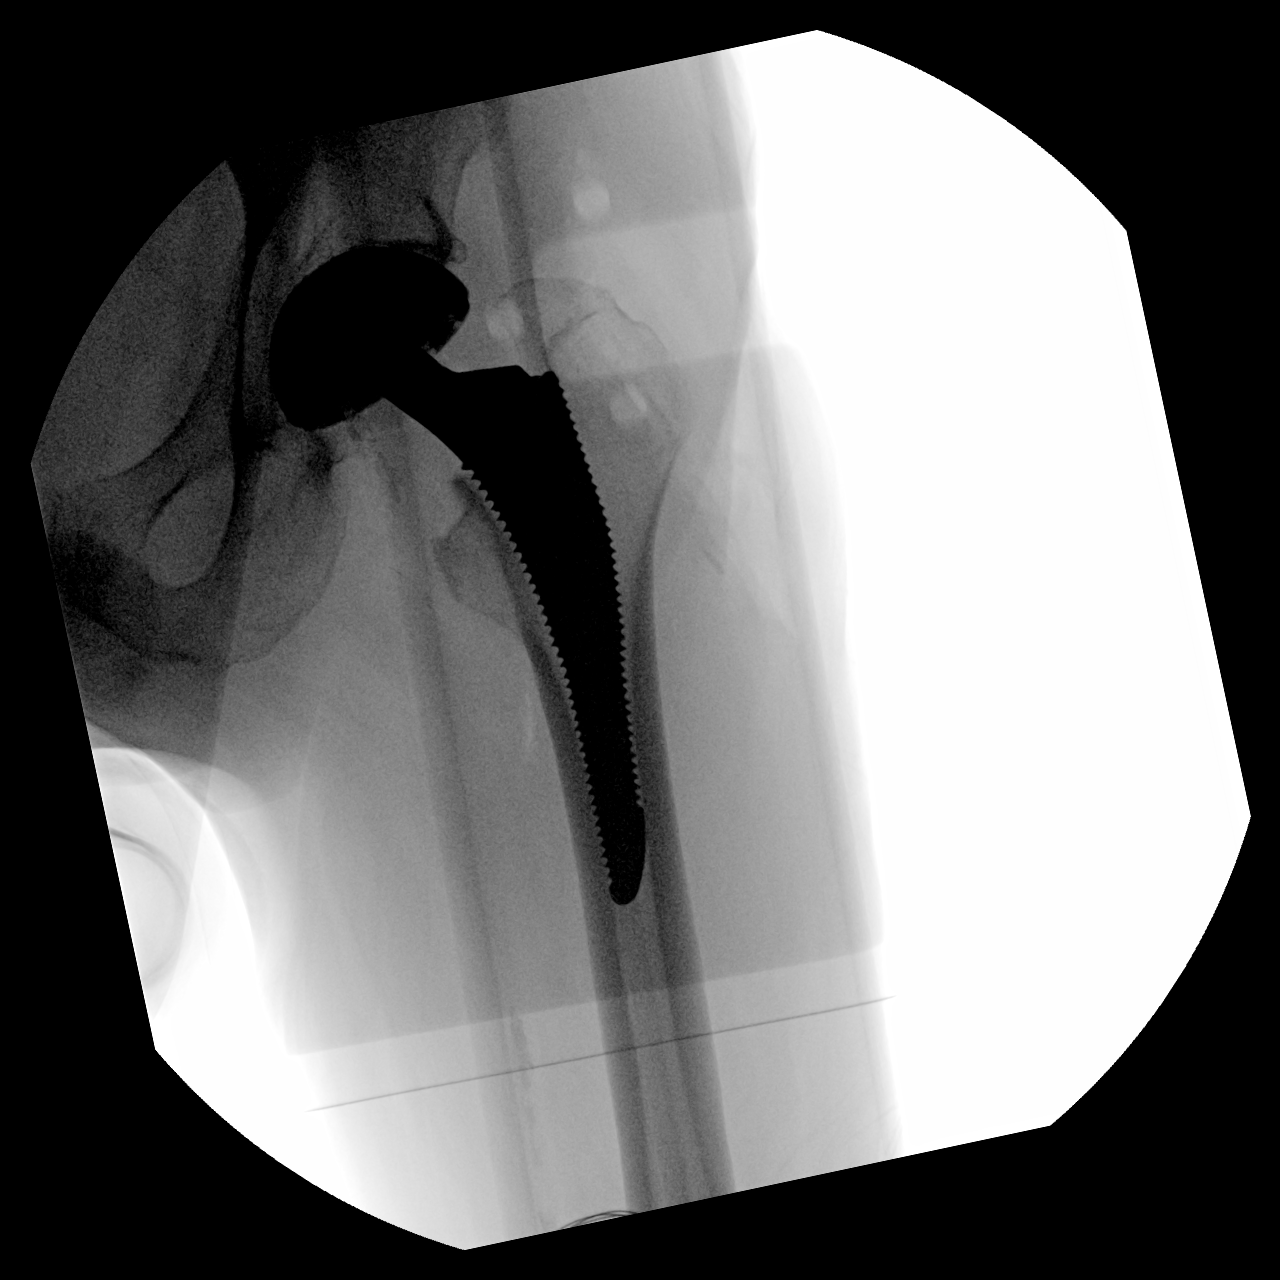
[im 2/3]
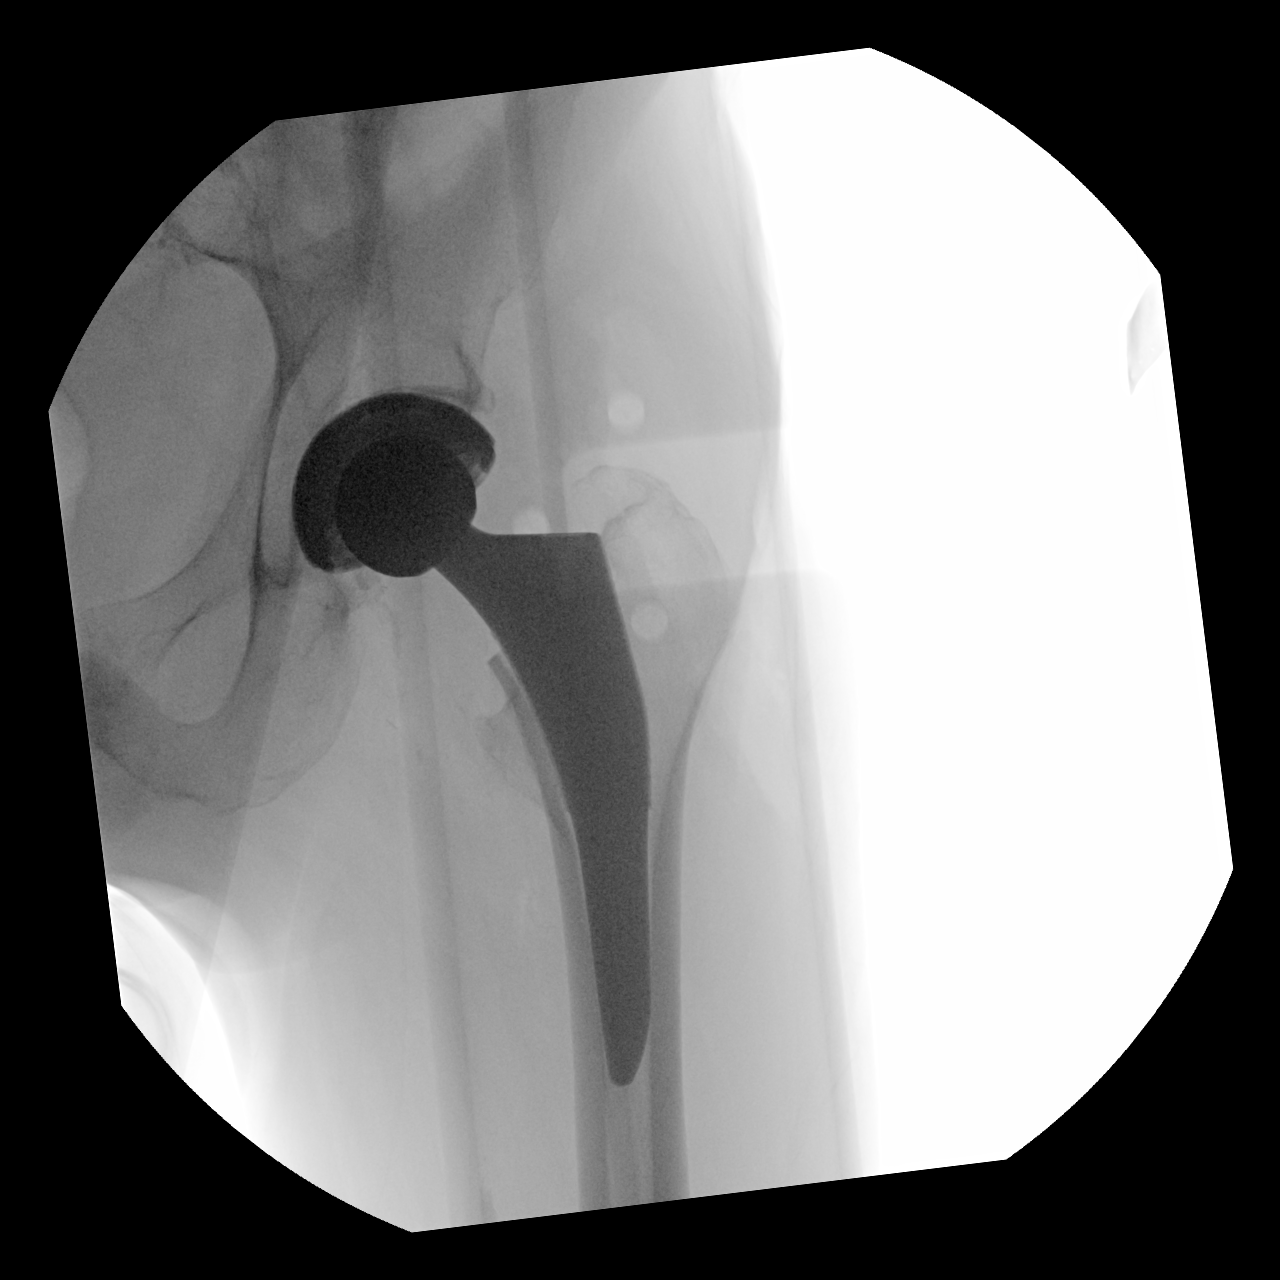
[im 3/3]
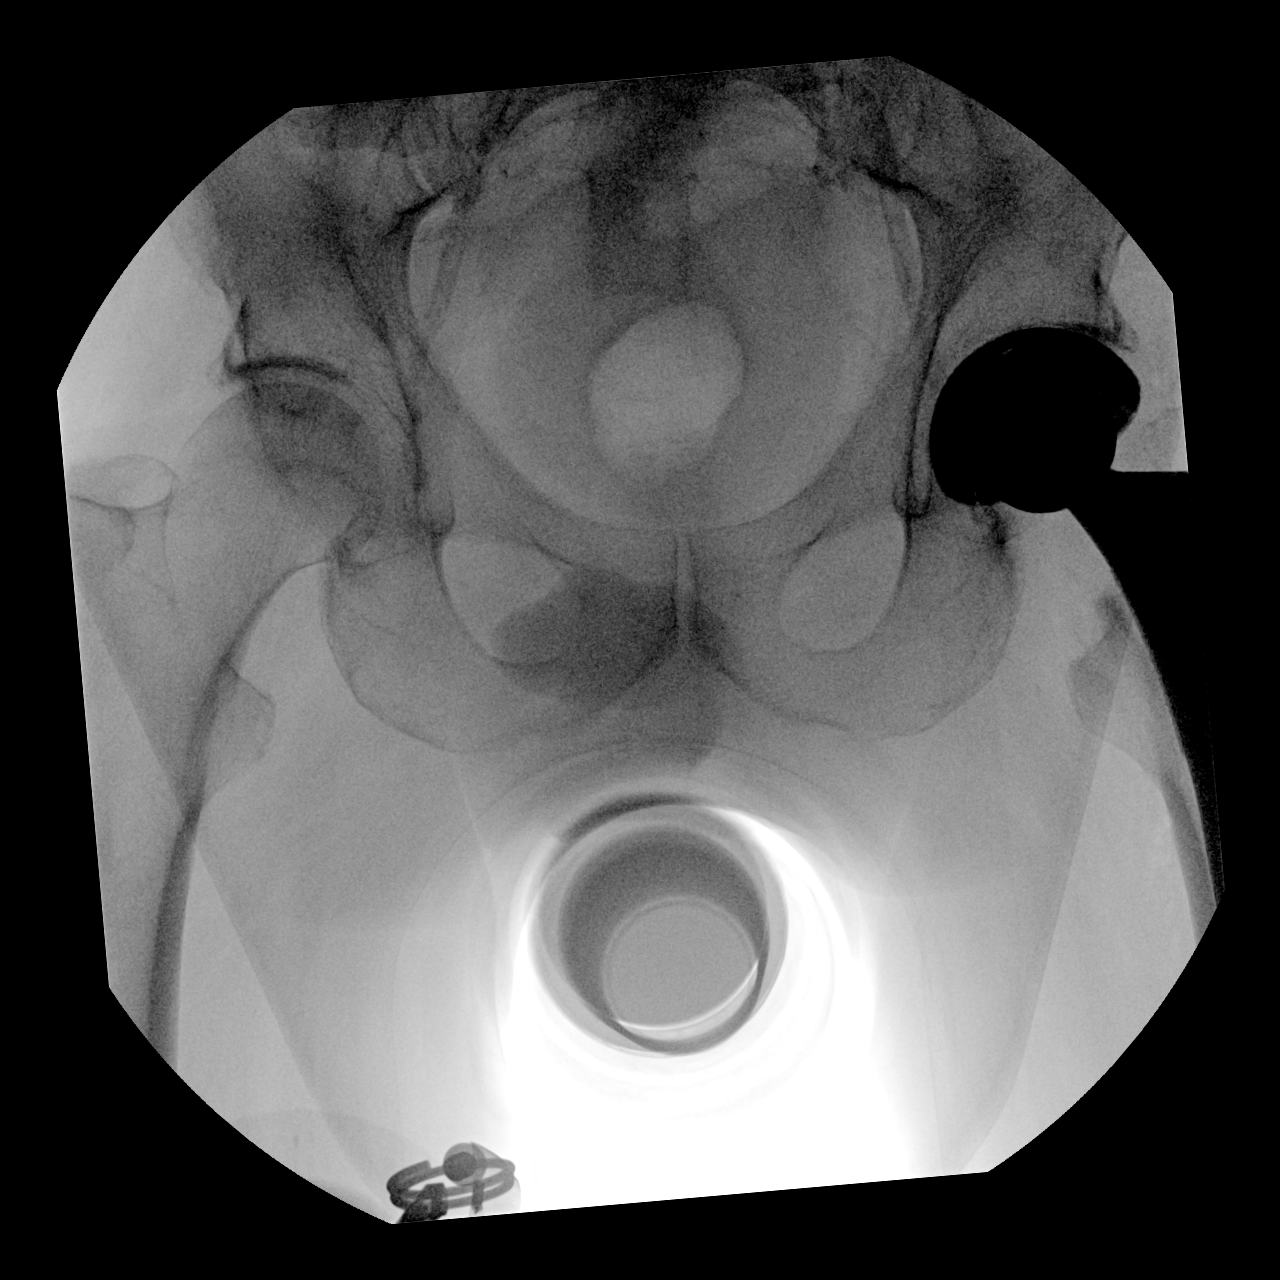

[3 of 3 positions shown; findings below may reference images not displayed]

FINDINGS: LEFT total hip arthroplasty identified without complicating
features.
IMPRESSION: LEFT total hip arthroplasty without complicating features

## 2019-03-11 SURGERY — ARTHROPLASTY, HIP, TOTAL, ANTERIOR APPROACH
Anesthesia: General | Site: Hip | Laterality: Left

## 2019-03-11 MED ORDER — PROPOFOL 10 MG/ML IV BOLUS
INTRAVENOUS | Status: DC | PRN
  Administered 2019-03-11: 100 mg via INTRAVENOUS

## 2019-03-11 MED ORDER — ONDANSETRON HCL 4 MG PO TABS: 4.0000 mg | ORAL_TABLET | Freq: Four times a day (QID) | ORAL | Status: DC | PRN

## 2019-03-11 MED ORDER — MORPHINE SULFATE (PF) 2 MG/ML IV SOLN: 0.5000 mg | INTRAVENOUS | Status: DC | PRN

## 2019-03-11 MED ORDER — LIDOCAINE 2% (20 MG/ML) 5 ML SYRINGE
INTRAMUSCULAR | Status: AC
  Filled 2019-03-11: qty 15

## 2019-03-11 MED ORDER — ENSURE PRE-SURGERY PO LIQD
296.0000 mL | Freq: Once | ORAL | Status: DC
  Filled 2019-03-11: qty 296

## 2019-03-11 MED ORDER — KETOROLAC TROMETHAMINE 30 MG/ML IJ SOLN
INTRAMUSCULAR | Status: AC
  Filled 2019-03-11: qty 1

## 2019-03-11 MED ORDER — ASPIRIN 81 MG PO CHEW
81.0000 mg | CHEWABLE_TABLET | Freq: Two times a day (BID) | ORAL | Status: DC
  Administered 2019-03-11 – 2019-03-13 (×5): 81 mg via ORAL
  Filled 2019-03-11 (×5): qty 1

## 2019-03-11 MED ORDER — LIDOCAINE 2% (20 MG/ML) 5 ML SYRINGE
INTRAMUSCULAR | Status: DC | PRN
  Administered 2019-03-11: 100 mg via INTRAVENOUS

## 2019-03-11 MED ORDER — KETOROLAC TROMETHAMINE 30 MG/ML IJ SOLN
INTRAMUSCULAR | Status: DC | PRN
  Administered 2019-03-11: 30 mg

## 2019-03-11 MED ORDER — LACTATED RINGERS IV SOLN
INTRAVENOUS | Status: DC | PRN
  Administered 2019-03-11: 12:00:00 via INTRAVENOUS

## 2019-03-11 MED ORDER — ROCURONIUM BROMIDE 10 MG/ML (PF) SYRINGE
PREFILLED_SYRINGE | INTRAVENOUS | Status: DC | PRN
  Administered 2019-03-11: 50 mg via INTRAVENOUS

## 2019-03-11 MED ORDER — BUPIVACAINE-EPINEPHRINE (PF) 0.5% -1:200000 IJ SOLN
INTRAMUSCULAR | Status: DC | PRN
  Administered 2019-03-11: 30 mL

## 2019-03-11 MED ORDER — PHENOL 1.4 % MT LIQD: 1.0000 | OROMUCOSAL | Status: DC | PRN

## 2019-03-11 MED ORDER — POVIDONE-IODINE 10 % EX SWAB: 2.0000 "application " | Freq: Once | CUTANEOUS | Status: DC

## 2019-03-11 MED ORDER — METOCLOPRAMIDE HCL 5 MG/ML IJ SOLN: 5.0000 mg | Freq: Three times a day (TID) | INTRAMUSCULAR | Status: DC | PRN

## 2019-03-11 MED ORDER — ONDANSETRON HCL 4 MG/2ML IJ SOLN: 4.0000 mg | Freq: Four times a day (QID) | INTRAMUSCULAR | Status: DC | PRN

## 2019-03-11 MED ORDER — HYDROCODONE-ACETAMINOPHEN 7.5-325 MG PO TABS: 1.0000 | ORAL_TABLET | ORAL | Status: DC | PRN

## 2019-03-11 MED ORDER — ALBUMIN HUMAN 5 % IV SOLN
INTRAVENOUS | Status: DC | PRN
  Administered 2019-03-11 (×2): via INTRAVENOUS

## 2019-03-11 MED ORDER — DEXAMETHASONE SODIUM PHOSPHATE 10 MG/ML IJ SOLN
INTRAMUSCULAR | Status: DC | PRN
  Administered 2019-03-11: 5 mg via INTRAVENOUS

## 2019-03-11 MED ORDER — PHENYLEPHRINE 40 MCG/ML (10ML) SYRINGE FOR IV PUSH (FOR BLOOD PRESSURE SUPPORT)
PREFILLED_SYRINGE | INTRAVENOUS | Status: AC
  Filled 2019-03-11: qty 20

## 2019-03-11 MED ORDER — FENTANYL CITRATE (PF) 250 MCG/5ML IJ SOLN
INTRAMUSCULAR | Status: DC | PRN
  Administered 2019-03-11: 50 ug via INTRAVENOUS

## 2019-03-11 MED ORDER — DOCUSATE SODIUM 100 MG PO CAPS
100.0000 mg | ORAL_CAPSULE | Freq: Two times a day (BID) | ORAL | Status: DC
  Administered 2019-03-11 – 2019-03-13 (×4): 100 mg via ORAL
  Filled 2019-03-11 (×4): qty 1

## 2019-03-11 MED ORDER — CHLORHEXIDINE GLUCONATE 4 % EX LIQD
60.0000 mL | Freq: Once | CUTANEOUS | Status: AC
  Administered 2019-03-11: 4 via TOPICAL
  Filled 2019-03-11: qty 60

## 2019-03-11 MED ORDER — TRANEXAMIC ACID-NACL 1000-0.7 MG/100ML-% IV SOLN
1000.0000 mg | INTRAVENOUS | Status: AC
  Administered 2019-03-11: 12:00:00 1000 mg via INTRAVENOUS
  Filled 2019-03-11: qty 100

## 2019-03-11 MED ORDER — 0.9 % SODIUM CHLORIDE (POUR BTL) OPTIME
TOPICAL | Status: DC | PRN
  Administered 2019-03-11: 1000 mL

## 2019-03-11 MED ORDER — ONDANSETRON HCL 4 MG/2ML IJ SOLN: 4.0000 mg | Freq: Once | INTRAMUSCULAR | Status: DC | PRN

## 2019-03-11 MED ORDER — SUGAMMADEX SODIUM 200 MG/2ML IV SOLN
INTRAVENOUS | Status: DC | PRN
  Administered 2019-03-11: 125 mg via INTRAVENOUS

## 2019-03-11 MED ORDER — HYDROCODONE-ACETAMINOPHEN 5-325 MG PO TABS: 1.0000 | ORAL_TABLET | ORAL | Status: DC | PRN

## 2019-03-11 MED ORDER — LACTATED RINGERS IV SOLN: INTRAVENOUS | Status: DC

## 2019-03-11 MED ORDER — FENTANYL CITRATE (PF) 250 MCG/5ML IJ SOLN
INTRAMUSCULAR | Status: AC
  Filled 2019-03-11: qty 5

## 2019-03-11 MED ORDER — SODIUM CHLORIDE 0.9 % IR SOLN
Status: DC | PRN
  Administered 2019-03-11: 1000 mL

## 2019-03-11 MED ORDER — PROPOFOL 10 MG/ML IV BOLUS
INTRAVENOUS | Status: AC
  Filled 2019-03-11: qty 20

## 2019-03-11 MED ORDER — MENTHOL 3 MG MT LOZG: 1.0000 | LOZENGE | OROMUCOSAL | Status: DC | PRN

## 2019-03-11 MED ORDER — CEFAZOLIN SODIUM-DEXTROSE 2-4 GM/100ML-% IV SOLN
2.0000 g | INTRAVENOUS | Status: AC
  Administered 2019-03-11: 2 g via INTRAVENOUS
  Filled 2019-03-11: qty 100

## 2019-03-11 MED ORDER — ACETAMINOPHEN 325 MG PO TABS: 325.0000 mg | ORAL_TABLET | Freq: Four times a day (QID) | ORAL | Status: DC | PRN

## 2019-03-11 MED ORDER — SODIUM CHLORIDE (PF) 0.9 % IJ SOLN
INTRAMUSCULAR | Status: DC | PRN
  Administered 2019-03-11: 30 mL

## 2019-03-11 MED ORDER — SODIUM CHLORIDE 0.9 % IV SOLN
INTRAVENOUS | Status: DC | PRN
  Administered 2019-03-11: 13:00:00 25 ug/min via INTRAVENOUS

## 2019-03-11 MED ORDER — HYDROMORPHONE HCL 1 MG/ML IJ SOLN: 0.2500 mg | INTRAMUSCULAR | Status: DC | PRN

## 2019-03-11 MED ORDER — SODIUM CHLORIDE 0.9 % IR SOLN
Status: DC | PRN
  Administered 2019-03-11: 3000 mL

## 2019-03-11 MED ORDER — ONDANSETRON HCL 4 MG/2ML IJ SOLN
INTRAMUSCULAR | Status: DC | PRN
  Administered 2019-03-11: 4 mg via INTRAVENOUS

## 2019-03-11 MED ORDER — MEPERIDINE HCL 25 MG/ML IJ SOLN: 6.2500 mg | INTRAMUSCULAR | Status: DC | PRN

## 2019-03-11 MED ORDER — BUPIVACAINE-EPINEPHRINE (PF) 0.5% -1:200000 IJ SOLN
INTRAMUSCULAR | Status: AC
  Filled 2019-03-11: qty 30

## 2019-03-11 MED ORDER — ROCURONIUM BROMIDE 10 MG/ML (PF) SYRINGE
PREFILLED_SYRINGE | INTRAVENOUS | Status: AC
  Filled 2019-03-11: qty 10

## 2019-03-11 MED ORDER — PHENYLEPHRINE 40 MCG/ML (10ML) SYRINGE FOR IV PUSH (FOR BLOOD PRESSURE SUPPORT)
PREFILLED_SYRINGE | INTRAVENOUS | Status: DC | PRN
  Administered 2019-03-11: 200 ug via INTRAVENOUS
  Administered 2019-03-11 (×6): 80 ug via INTRAVENOUS

## 2019-03-11 MED ORDER — METOCLOPRAMIDE HCL 10 MG PO TABS: 5.0000 mg | ORAL_TABLET | Freq: Three times a day (TID) | ORAL | Status: DC | PRN

## 2019-03-11 MED ORDER — CEFAZOLIN SODIUM-DEXTROSE 2-4 GM/100ML-% IV SOLN
2.0000 g | Freq: Three times a day (TID) | INTRAVENOUS | Status: AC
  Administered 2019-03-11 – 2019-03-12 (×2): 2 g via INTRAVENOUS
  Filled 2019-03-11 (×2): qty 100

## 2019-03-11 MED ORDER — MIDAZOLAM HCL 2 MG/2ML IJ SOLN
INTRAMUSCULAR | Status: AC
  Filled 2019-03-11: qty 2

## 2019-03-11 SURGICAL SUPPLY — 70 items
ALCOHOL ISOPROPYL (RUBBING) (MISCELLANEOUS) ×3 IMPLANT
BLADE CLIPPER SURG (BLADE) IMPLANT
CHLORAPREP W/TINT 26 (MISCELLANEOUS) ×3 IMPLANT
COVER SURGICAL LIGHT HANDLE (MISCELLANEOUS) ×3 IMPLANT
COVER WAND RF STERILE (DRAPES) ×3 IMPLANT
CUP SECTOR GRIPTON 50MM (Cup) ×2 IMPLANT
DERMABOND ADVANCED (GAUZE/BANDAGES/DRESSINGS) ×4
DERMABOND ADVANCED .7 DNX12 (GAUZE/BANDAGES/DRESSINGS) ×2 IMPLANT
DRAPE C-ARM 42X72 X-RAY (DRAPES) ×3 IMPLANT
DRAPE STERI IOBAN 125X83 (DRAPES) ×3 IMPLANT
DRAPE U-SHAPE 47X51 STRL (DRAPES) ×9 IMPLANT
DRSG AQUACEL AG ADV 3.5X10 (GAUZE/BANDAGES/DRESSINGS) ×3 IMPLANT
ELECT BLADE 4.0 EZ CLEAN MEGAD (MISCELLANEOUS) ×3
ELECT PENCIL ROCKER SW 15FT (MISCELLANEOUS) ×3 IMPLANT
ELECT REM PT RETURN 9FT ADLT (ELECTROSURGICAL) ×3
ELECTRODE BLDE 4.0 EZ CLN MEGD (MISCELLANEOUS) ×1 IMPLANT
ELECTRODE REM PT RTRN 9FT ADLT (ELECTROSURGICAL) ×1 IMPLANT
EVACUATOR 1/8 PVC DRAIN (DRAIN) IMPLANT
GLOVE BIO SURGEON STRL SZ7 (GLOVE) ×2 IMPLANT
GLOVE BIO SURGEON STRL SZ8.5 (GLOVE) ×6 IMPLANT
GLOVE BIOGEL PI IND STRL 7.5 (GLOVE) IMPLANT
GLOVE BIOGEL PI IND STRL 8 (GLOVE) IMPLANT
GLOVE BIOGEL PI IND STRL 8.5 (GLOVE) ×1 IMPLANT
GLOVE BIOGEL PI INDICATOR 7.5 (GLOVE) ×2
GLOVE BIOGEL PI INDICATOR 8 (GLOVE) ×2
GLOVE BIOGEL PI INDICATOR 8.5 (GLOVE) ×2
GLOVE SURG SS PI 7.0 STRL IVOR (GLOVE) ×2 IMPLANT
GOWN STRL REUS W/ TWL LRG LVL3 (GOWN DISPOSABLE) ×2 IMPLANT
GOWN STRL REUS W/TWL 2XL LVL3 (GOWN DISPOSABLE) ×3 IMPLANT
GOWN STRL REUS W/TWL LRG LVL3 (GOWN DISPOSABLE) ×4
HANDPIECE INTERPULSE COAX TIP (DISPOSABLE) ×2
HEAD FEMORAL 32 CERAMIC (Hips) ×2 IMPLANT
HOOD PEEL AWAY FACE SHEILD DIS (HOOD) ×6 IMPLANT
HOOD PEEL AWAY FLYTE STAYCOOL (MISCELLANEOUS) ×6 IMPLANT
JET LAVAGE IRRISEPT WOUND (IRRIGATION / IRRIGATOR) ×3
KIT BASIN OR (CUSTOM PROCEDURE TRAY) ×3 IMPLANT
KIT TURNOVER KIT B (KITS) ×3 IMPLANT
LAVAGE JET IRRISEPT WOUND (IRRIGATION / IRRIGATOR) IMPLANT
LINER ACETABULAR 32X50 (Liner) ×2 IMPLANT
MANIFOLD NEPTUNE II (INSTRUMENTS) ×3 IMPLANT
MARKER SKIN DUAL TIP RULER LAB (MISCELLANEOUS) ×6 IMPLANT
NDL SPNL 18GX3.5 QUINCKE PK (NEEDLE) ×1 IMPLANT
NEEDLE SPNL 18GX3.5 QUINCKE PK (NEEDLE) ×3 IMPLANT
NS IRRIG 1000ML POUR BTL (IV SOLUTION) ×3 IMPLANT
PACK TOTAL JOINT (CUSTOM PROCEDURE TRAY) ×3 IMPLANT
PACK UNIVERSAL I (CUSTOM PROCEDURE TRAY) ×3 IMPLANT
PAD ARMBOARD 7.5X6 YLW CONV (MISCELLANEOUS) ×6 IMPLANT
SAW OSC TIP CART 19.5X105X1.3 (SAW) ×3 IMPLANT
SEALER BIPOLAR AQUA 6.0 (INSTRUMENTS) ×2 IMPLANT
SET HNDPC FAN SPRY TIP SCT (DISPOSABLE) ×1 IMPLANT
SOL PREP POV-IOD 4OZ 10% (MISCELLANEOUS) ×3 IMPLANT
STEM TRI LOC BPS SZ4 W GRIPTON (Hips) IMPLANT
SUT ETHIBOND NAB CTX #1 30IN (SUTURE) ×4 IMPLANT
SUT ETHILON 3 0 FSL (SUTURE) ×4 IMPLANT
SUT MNCRL AB 3-0 PS2 18 (SUTURE) ×3 IMPLANT
SUT MON AB 2-0 CT1 36 (SUTURE) ×3 IMPLANT
SUT VIC AB 1 CT1 27 (SUTURE) ×2
SUT VIC AB 1 CT1 27XBRD ANBCTR (SUTURE) ×1 IMPLANT
SUT VIC AB 2-0 CT1 27 (SUTURE) ×2
SUT VIC AB 2-0 CT1 TAPERPNT 27 (SUTURE) ×1 IMPLANT
SUT VLOC 180 0 24IN GS25 (SUTURE) ×3 IMPLANT
SYR 50ML LL SCALE MARK (SYRINGE) ×3 IMPLANT
TOWEL GREEN STERILE (TOWEL DISPOSABLE) ×3 IMPLANT
TOWEL GREEN STERILE FF (TOWEL DISPOSABLE) ×3 IMPLANT
TRAY CATH 16FR W/PLASTIC CATH (SET/KITS/TRAYS/PACK) IMPLANT
TRAY FOLEY W/BAG SLVR 16FR (SET/KITS/TRAYS/PACK)
TRAY FOLEY W/BAG SLVR 16FR ST (SET/KITS/TRAYS/PACK) IMPLANT
TRI LOC BPS SZ 4 W GRIPTON (Hips) ×3 IMPLANT
WATER STERILE IRR 1000ML POUR (IV SOLUTION) ×5 IMPLANT
YANKAUER SUCT BULB TIP NO VENT (SUCTIONS) ×2 IMPLANT

## 2019-03-11 NOTE — Anesthesia Postprocedure Evaluation (Signed)
Anesthesia Post Note  Patient: Nathan Sellers  Procedure(s) Performed: TOTAL HIP ARTHROPLASTY ANTERIOR APPROACH (Left Hip)     Patient location during evaluation: PACU Anesthesia Type: General Level of consciousness: awake and alert Pain management: pain level controlled Vital Signs Assessment: post-procedure vital signs reviewed and stable Respiratory status: spontaneous breathing, nonlabored ventilation and respiratory function stable Cardiovascular status: blood pressure returned to baseline and stable Postop Assessment: no apparent nausea or vomiting Anesthetic complications: no    Last Vitals:  Vitals:   03/11/19 1445 03/11/19 1500  BP: 94/62 100/65  Pulse: 96 (!) 103  Resp: 20   Temp: 36.7 C 36.6 C  SpO2: 96%     Last Pain:  Vitals:   03/11/19 1445  TempSrc:   PainSc: 0-No pain                 Siena Poehler,W. EDMOND

## 2019-03-11 NOTE — Anesthesia Preprocedure Evaluation (Signed)
Anesthesia Evaluation  Patient identified by MRN, date of birth, ID band Patient awake    Reviewed: Allergy & Precautions, NPO status , Patient's Chart, lab work & pertinent test results  Airway Mallampati: II  TM Distance: >3 FB Neck ROM: Full    Dental  (+) Dental Advisory Given   Pulmonary neg pulmonary ROS, former smoker,    Pulmonary exam normal breath sounds clear to auscultation       Cardiovascular hypertension, negative cardio ROS Normal cardiovascular exam Rhythm:Regular Rate:Normal     Neuro/Psych negative neurological ROS  negative psych ROS   GI/Hepatic negative GI ROS, Neg liver ROS,   Endo/Other  negative endocrine ROS  Renal/GU Renal disease     Musculoskeletal negative musculoskeletal ROS (+)   Abdominal   Peds  Hematology negative hematology ROS (+)   Anesthesia Other Findings   Reproductive/Obstetrics negative OB ROS                             Anesthesia Physical Anesthesia Plan  ASA: III  Anesthesia Plan: General   Post-op Pain Management:    Induction: Intravenous  PONV Risk Score and Plan: 3 and Ondansetron, Dexamethasone and Treatment may vary due to age or medical condition  Airway Management Planned: Oral ETT  Additional Equipment:   Intra-op Plan:   Post-operative Plan: Extubation in OR  Informed Consent: I have reviewed the patients History and Physical, chart, labs and discussed the procedure including the risks, benefits and alternatives for the proposed anesthesia with the patient or authorized representative who has indicated his/her understanding and acceptance.     Dental advisory given  Plan Discussed with: CRNA  Anesthesia Plan Comments:         Anesthesia Quick Evaluation

## 2019-03-11 NOTE — Op Note (Signed)
OPERATIVE REPORT  SURGEON: Rod Can, MD   ASSISTANT: Laure Kidney, RNFA.  PREOPERATIVE DIAGNOSIS: Displaced Left femoral neck fracture.   POSTOPERATIVE DIAGNOSIS: Displaced Left femoral neck fracture.   PROCEDURE: Left total hip arthroplasty, anterior approach.   IMPLANTS: DePuy Tri Lock stem, size 4, hi offset. DePuy Pinnacle Cup, size 50 mm. DePuy Altrx liner, size 32 by 50 mm, neutral. DePuy Biolox ceramic head ball, size 32 + 1 mm.  ANESTHESIA:  General  ESTIMATED BLOOD LOSS:-50 mL    ANTIBIOTICS: 2 g Ancef.  DRAINS: None.  COMPLICATIONS: None.   CONDITION: PACU - hemodynamically stable.   BRIEF CLINICAL NOTE: Nathan Sellers is a 73 y.o. male with a displaced Left femoral neck fracture. The patient was admitted to the hospitalist service and underwent perioperative risk stratification and medical optimization. The risks, benefits, and alternatives to total hip arthroplasty were explained, and the patient elected to proceed.  PROCEDURE IN DETAIL: The patient was taken to the operating room and general anesthesia was induced on the hospital bed. The patient was then positioned on the Hana table. All bony prominences were well padded. The hip was prepped and draped in the normal sterile surgical fashion. A time-out was called verifying side and site of surgery. Antibiotics were given within 60 minutes of beginning the procedure.  The direct anterior approach to the hip was performed through the Hueter interval. Lateral femoral circumflex vessels were treated with the Auqumantys. The anterior capsule was exposed and an inverted T capsulotomy was made. Fracture hematoma was encountered and evacuated. The patient was found to have a comminuted Left subcapital femoral neck fracture. I freshened the femoral neck cut with a saw. I removed the femoral neck fragment. A corkscrew was placed into the head and the head was removed. This was passed to the back table and  was measured.  Acetabular exposure was achieved, and the pulvinar and labrum were excised. Sequential reaming of the acetabulum was then performed up to a size 49 mm reamer. A 50 mm cup was then opened and impacted into place at approximately 40 degrees of abduction and 20 degrees of anteversion. The final polyethylene liner was impacted into place and acetabular osteophytes were removed.   I then gained femoral exposure taking care to protect the abductors and greater trochanter. This was performed using standard external rotation, extension, and adduction. The capsule was peeled off the inner aspect of the greater trochanter, taking care to preserve the short external rotators. A cookie cutter was used to enter the femoral canal, and then the femoral canal finder was placed. Sequential broaching was performed up to a size 4. Calcar planer was used on the femoral neck remnant. I placed a hi offset neck and a trial head ball. The hip was reduced. Leg lengths and offset were checked fluoroscopically. The hip was dislocated and trial components were removed. The final implants were placed, and the hip was reduced.  Fluoroscopy was used to confirm component position and leg lengths. At 90 degrees of external rotation and full extension, the hip was stable to an anterior directed force.  The wound was copiously irrigated with normal saline using pulse lavage. Marcaine solution was injected into the periarticular soft tissue. The wound was closed in layers using #1 Vicryl and V-Loc for the fascia, 2-0 Vicryl for the subcutaneous fat, 2-0 Monocryl for the deep dermal layer, 3-0 running Monocryl subcuticular stitch, and Dermabond for the skin. Once the glue was fully dried, an Aquacell Ag dressing was  applied. The patient was transported to the recovery room in stable condition. Sponge, needle, and instrument counts were correct at the end of the case x2. The patient tolerated the procedure well  and there were no known complications.

## 2019-03-11 NOTE — Progress Notes (Signed)
This RN spoke with wife to update about plan of care.

## 2019-03-11 NOTE — Anesthesia Procedure Notes (Signed)
Procedure Name: Intubation Date/Time: 03/11/2019 12:23 PM Performed by: Colin Benton, CRNA Pre-anesthesia Checklist: Patient identified, Emergency Drugs available, Patient being monitored and Suction available Patient Re-evaluated:Patient Re-evaluated prior to induction Oxygen Delivery Method: Circle system utilized Preoxygenation: Pre-oxygenation with 100% oxygen Induction Type: IV induction Ventilation: Mask ventilation without difficulty and Oral airway inserted - appropriate to patient size Laryngoscope Size: Sabra Heck and 2 Grade View: Grade I Tube type: Oral Tube size: 7.0 mm Number of attempts: 1 Airway Equipment and Method: Stylet Placement Confirmation: ETT inserted through vocal cords under direct vision,  positive ETCO2 and breath sounds checked- equal and bilateral Secured at: 22 cm Tube secured with: Tape Dental Injury: Teeth and Oropharynx as per pre-operative assessment

## 2019-03-11 NOTE — Discharge Instructions (Signed)
°Dr. Karry Barrilleaux °Joint Replacement Specialist °[facility] °[address] °[place], [place] [address] °[phone] ° ° °TOTAL HIP REPLACEMENT POSTOPERATIVE DIRECTIONS ° ° ° °Hip Rehabilitation, Guidelines Following Surgery  ° °WEIGHT BEARING °Weight bearing as tolerated with assist device (walker, cane, etc) as directed, use it as long as suggested by your surgeon or therapist, typically at least 4-6 weeks. ° °The results of a hip operation are greatly improved after range of motion and muscle strengthening exercises. Follow all safety measures which are given to protect your hip. If any of these exercises cause increased pain or swelling in your joint, decrease the amount until you are comfortable again. Then slowly increase the exercises. Call your caregiver if you have problems or questions.  ° °HOME CARE INSTRUCTIONS  °Most of the following instructions are designed to prevent the dislocation of your new hip.  °Remove items at home which could result in a fall. This includes throw rugs or furniture in walking pathways.  °Continue medications as instructed at time of discharge. °· You may have some home medications which will be placed on hold until you complete the course of blood thinner medication. °· You may start showering once you are discharged home. Do not remove your dressing. °Do not put on socks or shoes without following the instructions of your caregivers.   °Sit on chairs with arms. Use the chair arms to help push yourself up when arising.  °Arrange for the use of a toilet seat elevator so you are not sitting low.  °· Walk with walker as instructed.  °You may resume a sexual relationship in one month or when given the OK by your caregiver.  °Use walker as long as suggested by your caregivers.  °You may put full weight on your legs and walk as much as is comfortable. °Avoid periods of inactivity such as sitting longer than an hour when not asleep. This helps prevent  blood clots.  °You may return to work once you are cleared by your surgeon.  °Do not drive a car for 6 weeks or until released by your surgeon.  °Do not drive while taking narcotics.  °Wear elastic stockings for two weeks following surgery during the day but you may remove then at night.  °Make sure you keep all of your appointments after your operation with all of your doctors and caregivers. You should call the office at the above phone number and make an appointment for approximately two weeks after the date of your surgery. °Please pick up a stool softener and laxative for home use as long as you are requiring pain medications. °· ICE to the affected hip every three hours for 30 minutes at a time and then as needed for pain and swelling. Continue to use ice on the hip for pain and swelling from surgery. You may notice swelling that will progress down to the foot and ankle.  This is normal after surgery.  Elevate the leg when you are not up walking on it.   °It is important for you to complete the blood thinner medication as prescribed by your doctor. °· Continue to use the breathing machine which will help keep your temperature down.  It is common for your temperature to cycle up and down following surgery, especially at night when you are not up moving around and exerting yourself.  The breathing machine keeps your lungs expanded and your temperature down. ° °RANGE OF MOTION AND STRENGTHENING EXERCISES  °These exercises are   designed to help you keep full movement of your hip joint. Follow your caregiver's or physical therapist's instructions. Perform all exercises about fifteen times, three times per day or as directed. Exercise both hips, even if you have had only one joint replacement. These exercises can be done on a training (exercise) mat, on the floor, on a table or on a bed. Use whatever works the best and is most comfortable for you. Use music or television while you are exercising so that the exercises  are a pleasant break in your day. This will make your life better with the exercises acting as a break in routine you can look forward to.  °Lying on your back, slowly slide your foot toward your buttocks, raising your knee up off the floor. Then slowly slide your foot back down until your leg is straight again.  °Lying on your back spread your legs as far apart as you can without causing discomfort.  °Lying on your side, raise your upper leg and foot straight up from the floor as far as is comfortable. Slowly lower the leg and repeat.  °Lying on your back, tighten up the muscle in the front of your thigh (quadriceps muscles). You can do this by keeping your leg straight and trying to raise your heel off the floor. This helps strengthen the largest muscle supporting your knee.  °Lying on your back, tighten up the muscles of your buttocks both with the legs straight and with the knee bent at a comfortable angle while keeping your heel on the floor.  ° °SKILLED REHAB INSTRUCTIONS: °If the patient is transferred to a skilled rehab facility following release from the hospital, a list of the current medications will be sent to the facility for the patient to continue.  When discharged from the skilled rehab facility, please have the facility set up the patient's Home Health Physical Therapy prior to being released. Also, the skilled facility will be responsible for providing the patient with their medications at time of release from the facility to include their pain medication and their blood thinner medication. If the patient is still at the rehab facility at time of the two week follow up appointment, the skilled rehab facility will also need to assist the patient in arranging follow up appointment in our office and any transportation needs. ° °MAKE SURE YOU:  °Understand these instructions.  °Will watch your condition.  °Will get help right away if you are not doing well or get worse. ° °Pick up stool softner and  laxative for home use following surgery while on pain medications. °Do not remove your dressing. °The dressing is waterproof--it is OK to take showers. °Continue to use ice for pain and swelling after surgery. °Do not use any lotions or creams on the incision until instructed by your surgeon. °Total Hip Protocol. ° ° °

## 2019-03-11 NOTE — Transfer of Care (Signed)
Immediate Anesthesia Transfer of Care Note  Patient: Nathan Sellers  Procedure(s) Performed: TOTAL HIP ARTHROPLASTY ANTERIOR APPROACH (Left Hip)  Patient Location: PACU  Anesthesia Type:General  Level of Consciousness: awake, alert , oriented and patient cooperative  Airway & Oxygen Therapy: Patient Spontanous Breathing and Patient connected to face mask oxygen  Post-op Assessment: Report given to RN and Post -op Vital signs reviewed and stable  Post vital signs: Reviewed and stable  Last Vitals:  Vitals Value Taken Time  BP 75/38 03/11/19 1415  Temp    Pulse 95 03/11/19 1418  Resp 23 03/11/19 1418  SpO2 100 % 03/11/19 1418  Vitals shown include unvalidated device data.  Last Pain:  Vitals:   03/10/19 2036  TempSrc:   PainSc: 0-No pain         Complications: No apparent anesthesia complications

## 2019-03-11 NOTE — Progress Notes (Addendum)
PROGRESS NOTE    Nathan Sellers  ZOX:[id number] DOB: June 26, 1946 DOA: 03/10/2019 PCP: Wilford Corner, PA-C   Brief Narrative:  Nathan Sellers is a 73 y.o. male with medical history significant for COPD, hypertension, peripheral vascular disease and coronary artery disease, presented to the ED with complaints of multiple falls, last fall was thursday, with bruising of his extremities.  He reported left hip pain and he was unable to ambulate..  Patient is extremely hard of hearing.  He has chronic shortness of breath.  Per wife, he had no other complaint however he has some productive cough lately.  Reportedly he drinks daily and drinks about 2 to 3 glasses of wine.  He is noncompliant with his antihypertensives.  Upon initial presentation to emergency department, he was tachycardic to 107, tachypneic 26-42, blood pressure systolic 10 7-1 64.  O2 sats greater than 93% on room air.  Sodium low 121, with low bicarb 16, with anion gap of 16, creatinine elevated at 2, blood alcohol level less than 10, Tylenol and salicylate levels unremarkable.  Lactic acid 2.3 >> 1.6.  Normal TSH.  UA with moderate hemoglobin, no bacteria.  Negative high-sensitivity troponin x2.  UDS Clean.  Hgb 9.7 down from 12.6 eight months ago.  Head CT negative for acute abnormality.  Chest x-ray right lower lobe pulmonary nodule.  Left forearm x-ray - distal soft tissue swelling.  No fracture. Hip xray- shows a mildly displaced left femoral neck fracture.  Dr. Ranell Patrick on call for Ortho at [facility] consulted recommended transfer over to [facility].  Hospitalist admitted patient.  Consultants:   Orthopedics  Procedures:   None  Antimicrobials:   None   Subjective: Patient seen and examined.  Very hard of hearing.  Was on 2 L of oxygen.  Did complain of some left hip pain radiating to the left knee.  Had no other complaint.  Did not have any anxiety or tremors.  He was calm.  Objective: Vitals:   03/10/19 1834  03/10/19 2135 03/11/19 0529 03/11/19 1156  BP: (!) 142/90 135/78 (!) 142/66   Pulse: (!) 111 (!) 105 99   Resp: 20 (!) 28 15   Temp: 97.7 F (36.5 C) 98.7 F (37.1 C) 99 F (37.2 C)   TempSrc: Oral     SpO2: 95% 100% 97%   Weight:    59 kg  Height:    5' 7.01" (1.702 m)    Intake/Output Summary (Last 24 hours) at 03/11/2019 1222 Last data filed at 03/11/2019 0300 Gross per 24 hour  Intake -  Output 800 ml  Net -800 ml   Filed Weights   03/10/19 1042 03/11/19 1156  Weight: 59 kg 59 kg    Examination:  General exam: Appears calm and comfortable  Respiratory system: Clear to auscultation. Respiratory effort normal. Cardiovascular system: S1 & S2 heard, RRR. No JVD, murmurs, rubs, gallops or clicks. No pedal edema. Gastrointestinal system: Abdomen is nondistended, soft and nontender. No organomegaly or masses felt. Normal bowel sounds heard. Central nervous system: Alert and oriented. No focal neurological deficits. Skin: No rashes, lesions or ulcers.  He has a very large bruise in the left upper extremity which is involving almost 80% of the left upper extremity and also has a large bruise on the right upper extremity which is involving around 30 to 40%.  It is nontender. Psychiatry: Judgement and insight appear poor. Mood & affect appropriate.    Data Reviewed: I have personally reviewed following  labs and imaging studies  CBC: Recent Labs  Lab 03/10/19 1115 03/11/19 0715  WBC 8.5 5.6  NEUTROABS 7.9*  --   HGB 9.7* 8.7*  HCT 27.5* 24.8*  MCV 106.2* 105.5*  PLT 193 174   Basic Metabolic Panel: Recent Labs  Lab 03/10/19 1115 03/10/19 1646 03/10/19 2036 03/11/19 0251 03/11/19 0715 03/11/19 1036  NA 121* 122* 122* 124* 126*  --   K 4.5 4.3 4.0 4.1 3.8  --   CL 89* 93* 94* 97* 97*  --   CO2 16* 16* 14* 16* 15*  --   GLUCOSE 107* 90 81 80 82  --   BUN 31* 33* 31* 29* 28*  --   CREATININE 2.04* 1.80* 1.92* 1.74* 1.70*  --   CALCIUM 8.2* 7.7* 7.8* 7.9* 8.0*  --    MG  --  1.5*  --   --   --  2.1  PHOS  --  3.4  --   --   --   --    GFR: Estimated Creatinine Clearance: 32.8 mL/min (A) (by C-G formula based on SCr of 1.7 mg/dL (H)). Liver Function Tests: Recent Labs  Lab 03/10/19 1115  AST 28  ALT 18  ALKPHOS 77  BILITOT 1.7*  PROT 6.8  ALBUMIN 3.4*   Recent Labs  Lab 03/10/19 1115  LIPASE 33   Recent Labs  Lab 03/10/19 1115  AMMONIA 18   Coagulation Profile: Recent Labs  Lab 03/10/19 1115  INR 1.1   Cardiac Enzymes: No results for input(s): CKTOTAL, CKMB, CKMBINDEX, TROPONINI in the last 168 hours. BNP (last 3 results) No results for input(s): PROBNP in the last 8760 hours. HbA1C: No results for input(s): HGBA1C in the last 72 hours. CBG: No results for input(s): GLUCAP in the last 168 hours. Lipid Profile: No results for input(s): CHOL, HDL, LDLCALC, TRIG, CHOLHDL, LDLDIRECT in the last 72 hours. Thyroid Function Tests: Recent Labs    03/10/19 1118  TSH 1.518   Anemia Panel: Recent Labs    03/11/19 0715  FERRITIN 318  TIBC 249*  IRON 47   Sepsis Labs: Recent Labs  Lab 03/10/19 1115 03/10/19 1244  LATICACIDVEN 2.3* 1.6    Recent Results (from the past 240 hour(s))  SARS Coronavirus 2 (CEPHEID- Performed in [facility] hospital lab), Hosp Order     Status: None   Collection Time: 03/10/19 11:31 AM   Specimen: Nasopharyngeal Swab  Result Value Ref Range Status   SARS Coronavirus 2 NEGATIVE NEGATIVE Final    Comment: (NOTE) If result is NEGATIVE SARS-CoV-2 target nucleic acids are NOT DETECTED. The SARS-CoV-2 RNA is generally detectable in upper and lower  respiratory specimens during the acute phase of infection. The lowest  concentration of SARS-CoV-2 viral copies this assay can detect is 250  copies / mL. A negative result does not preclude SARS-CoV-2 infection  and should not be used as the sole basis for treatment or other  patient management decisions.  A negative result may occur with   improper specimen collection / handling, submission of specimen other  than nasopharyngeal swab, presence of viral mutation(s) within the  areas targeted by this assay, and inadequate number of viral copies  (<250 copies / mL). A negative result must be combined with clinical  observations, patient history, and epidemiological information. If result is POSITIVE SARS-CoV-2 target nucleic acids are DETECTED. The SARS-CoV-2 RNA is generally detectable in upper and lower  respiratory specimens dur ing the acute phase of infection.  Positive  results are indicative of active infection with SARS-CoV-2.  Clinical  correlation with patient history and other diagnostic information is  necessary to determine patient infection status.  Positive results do  not rule out bacterial infection or co-infection with other viruses. If result is PRESUMPTIVE POSTIVE SARS-CoV-2 nucleic acids MAY BE PRESENT.   A presumptive positive result was obtained on the submitted specimen  and confirmed on repeat testing.  While 2019 novel coronavirus  (SARS-CoV-2) nucleic acids may be present in the submitted sample  additional confirmatory testing may be necessary for epidemiological  and / or clinical management purposes  to differentiate between  SARS-CoV-2 and other Sarbecovirus currently known to infect humans.  If clinically indicated additional testing with an alternate test  methodology [phone]([id number]) is advised. The SARS-CoV-2 RNA is generally  detectable in upper and lower respiratory sp ecimens during the acute  phase of infection. The expected result is Negative. Fact Sheet for Patients:  BoilerBrush.com.cyhttps://www.fda.gov/media/136312/download Fact Sheet for Healthcare Providers: https://pope.com/https://www.fda.gov/media/136313/download This test is not yet approved or cleared by the [place] FDA and has been authorized for detection and/or diagnosis of SARS-CoV-2 by FDA under an Emergency Use Authorization (EUA).  This EUA will  remain in effect (meaning this test can be used) for the duration of the COVID-19 declaration under Section 564(b)(1) of the Act, 21 U.S.C. section 360bbb-3(b)(1), unless the authorization is terminated or revoked sooner. Performed at [facility], [address] [place], [place] [address]   Surgical pcr screen     Status: None   Collection Time: 03/11/19  9:01 AM   Specimen: Nasal Mucosa; Nasal Swab  Result Value Ref Range Status   MRSA, PCR NEGATIVE NEGATIVE Final   Staphylococcus aureus NEGATIVE NEGATIVE Final    Comment: (NOTE) The Xpert SA Assay (FDA approved for NASAL specimens in patients 72 years of age and older), is one component of a comprehensive surveillance program. It is not intended to diagnose infection nor to guide or monitor treatment. Performed at [facility], [address] [place], [place] [address]       Radiology Studies: Dg Pelvis 1-2 Views  Result Date: 03/10/2019 CLINICAL DATA:  Fall yesterday. Pelvis and left hip pain. EXAM: PELVIS - 1-2 VIEW COMPARISON:  None. FINDINGS: A mildly displaced left femoral neck fracture is seen. No evidence of hip dislocation. No evidence of pelvic fracture. Extensive atherosclerotic calcification involving the iliac arteries. IMPRESSION: Mildly displaced left femoral neck fracture. Electronically Signed   By: Danae OrleansJohn A Stahl M.D.   On: 03/10/2019 14:23   Dg Forearm Left  Result Date: 03/10/2019 CLINICAL DATA:  Fall yesterday. Left forearm pain and bruising. Initial encounter. EXAM: LEFT FOREARM - 2 VIEW COMPARISON:  None. FINDINGS: There is no evidence of fracture or other focal bone lesions. Moderate soft tissue swelling is seen involving the distal forearm. Mild peripheral vascular calcification also seen. IMPRESSION: Distal forearm soft tissue swelling. No evidence of fracture. Electronically Signed   By: Danae OrleansJohn A Stahl M.D.   On: 03/10/2019 14:21   Ct Head Wo Contrast  Result Date: 03/10/2019 CLINICAL DATA:  Recent  fall. Minor blunt head trauma. Initial encounter. EXAM: CT HEAD WITHOUT CONTRAST TECHNIQUE: Contiguous axial images were obtained from the base of the skull through the vertex without intravenous contrast. COMPARISON:  None. FINDINGS: Brain: No evidence of acute infarction, hemorrhage, hydrocephalus, extra-axial collection, or mass lesion/mass effect. Mild diffuse cerebral atrophy and chronic small vessel disease. Vascular:  No hyperdense vessel or other acute findings.  Skull: No evidence of fracture or other significant bone abnormality. Sinuses/Orbits:  No acute findings. Other: None. IMPRESSION: No acute intracranial abnormality. Mild cerebral atrophy and chronic small vessel disease. Electronically Signed   By: Danae OrleansJohn A Stahl M.D.   On: 03/10/2019 14:07   Dg Chest Portable 1 View  Result Date: 03/10/2019 CLINICAL DATA:  Shortness of breath. Dyspnea. COPD. Recent falls. EXAM: PORTABLE CHEST 1 VIEW COMPARISON:  07/25/2018 FINDINGS: The heart size and mediastinal contours are within normal limits. Aortic atherosclerosis. No evidence of acute pulmonary infiltrate or edema. No evidence of pleural effusion. A small nodular density is seen in right lower lobe, as seen on recent PET-CT. The visualized skeletal structures are unremarkable. IMPRESSION: 1. No acute findings. 2. Right lower lobe pulmonary nodule, as seen on recent PET-CT. Electronically Signed   By: Danae OrleansJohn A Stahl M.D.   On: 03/10/2019 14:20    Scheduled Meds: . [MAR Hold] atorvastatin  40 mg Oral Daily  . feeding supplement  296 mL Oral Once  . [MAR Hold] folic acid  1 mg Oral Daily  . [MAR Hold] ipratropium-albuterol  3 mL Nebulization Once  . [MAR Hold] multivitamin with minerals  1 tablet Oral Daily  . povidone-iodine  2 application Topical Once  . [MAR Hold] thiamine  100 mg Oral Daily   Or  . [MAR Hold] thiamine  100 mg Intravenous Daily   Continuous Infusions: . sodium chloride 100 mL/hr at 03/10/19 1607  .  ceFAZolin (ANCEF) IV    .  lactated ringers    . tranexamic acid       LOS: 1 day   Assessment & Plan:   Active Problems:   Hyponatremia   Left displaced femoral neck fracture (HCC)   Essential hypertension   Multiple falls   Chronic alcoholism (HCC)   CKD (chronic kidney disease), stage III (HCC)   AKI (acute kidney injury) (HCC)   Multiple falls/left femur fracture: He is going to have surgery by orthopedics today.  We will consult PT OT after that.  His falls could be secondary to hyponatremia as well as chronic alcoholism.  Chronic alcoholism: No signs of withdrawal at this point in time.  CIWA has been 0 throughout.  Continue CIWA protocol with as needed Ativan and multivitamins.  Acute hyponatremia: Initial sodium 121.  Currently 126.  Work-up does not indicate SIADH.  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  Continue gentle hydration and monitor closely.  COPD: Currently on only 2 L of oxygen.  Likely due to atelectasis.  No COPD exacerbation.  No wheezes.  Continue DuoNeb as needed.  Resume home medications.  Pulmonary nodule: Will need follow-up imaging studies as outpatient.  Hypertension: Reportedly is noncompliant with his medications.  Reportedly his HCTZ was discontinued during last hospitalization but he continues to take it.  I will hold that for now.  His blood pressure is normal despite of being off of his antihypertensives so I will continue as it is and monitor for now.  Acute on chronic kidney disease stage III: Renal function back to his baseline.  Continue to watch.  Anion gap metabolic acidosis: Like be due to dehydration and AKI.  Resolved.  Hyperlipidemia: Continue home statins.  DVT prophylaxis: Lovenox which is on hold due to surgery today. Code Status: DNR Family Communication: None present at bedside.  Plan discussed with patient in detail. Disposition Plan: To be determined   Time spent: 35 minutes   Hughie Clossavi Charl Wellen, MD [facility] Pager [phone]  If  7PM-7AM,  please contact night-coverage [email or web address] Password [facility] 03/11/2019, 12:22 PM

## 2019-03-11 NOTE — Interval H&P Note (Signed)
History and Physical Interval Note:  03/11/2019 11:56 AM  Nathan Sellers  has presented today for surgery, with the diagnosis of Left Femur Fracture.  The various methods of treatment have been discussed with the patient and family. After consideration of risks, benefits and other options for treatment, the patient has consented to  Procedure(s): TOTAL HIP ARTHROPLASTY ANTERIOR APPROACH (Left) as a surgical intervention.  The patient's history has been reviewed, patient examined, no change in status, stable for surgery.  I have reviewed the patient's chart and labs.  Questions were answered to the patient's satisfaction.    Unable to palpate pedal pulses. Triphasic DP signal with doppler.  The risks, benefits, and alternatives were discussed with the patient. There are risks associated with the surgery including, but not limited to, problems with anesthesia (death), infection, instability (giving out of the joint), dislocation, differences in leg length/angulation/rotation, fracture of bones, loosening or failure of implants, hematoma (blood accumulation) which may require surgical drainage, blood clots, pulmonary embolism, nerve injury (foot drop and lateral thigh numbness), and blood vessel injury. The patient understands these risks and elects to proceed.    Nathan Sellers

## 2019-03-12 ENCOUNTER — Encounter (HOSPITAL_COMMUNITY): Payer: Self-pay | Admitting: Orthopedic Surgery

## 2019-03-12 DIAGNOSIS — S72002A Fracture of unspecified part of neck of left femur, initial encounter for closed fracture: Secondary | ICD-10-CM

## 2019-03-12 DIAGNOSIS — N183 Chronic kidney disease, stage 3 (moderate): Secondary | ICD-10-CM

## 2019-03-12 DIAGNOSIS — R296 Repeated falls: Secondary | ICD-10-CM

## 2019-03-12 DIAGNOSIS — N179 Acute kidney failure, unspecified: Secondary | ICD-10-CM

## 2019-03-12 LAB — COMPREHENSIVE METABOLIC PANEL
ALT: 18 U/L (ref 0–44)
AST: 33 U/L (ref 15–41)
Albumin: 2.7 g/dL — ABNORMAL LOW (ref 3.5–5.0)
Alkaline Phosphatase: 42 U/L (ref 38–126)
Anion gap: 12 (ref 5–15)
BUN: 26 mg/dL — ABNORMAL HIGH (ref 8–23)
CO2: 17 mmol/L — ABNORMAL LOW (ref 22–32)
Calcium: 8 mg/dL — ABNORMAL LOW (ref 8.9–10.3)
Chloride: 103 mmol/L (ref 98–111)
Creatinine, Ser: 1.46 mg/dL — ABNORMAL HIGH (ref 0.61–1.24)
GFR calc Af Amer: 55 mL/min — ABNORMAL LOW (ref >60)
GFR calc non Af Amer: 47 mL/min — ABNORMAL LOW (ref >60)
Glucose, Bld: 115 mg/dL — ABNORMAL HIGH (ref 70–99)
Potassium: 4.1 mmol/L (ref 3.5–5.1)
Sodium: 132 mmol/L — ABNORMAL LOW (ref 135–145)
Total Bilirubin: 1 mg/dL (ref 0.3–1.2)
Total Protein: 5 g/dL — ABNORMAL LOW (ref 6.5–8.1)

## 2019-03-12 LAB — CBC WITH DIFFERENTIAL/PLATELET
Abs Immature Granulocytes: 0.04 10*3/uL (ref 0.00–0.07)
Basophils Absolute: 0 10*3/uL (ref 0.0–0.1)
Basophils Relative: 0 %
Eosinophils Absolute: 0 10*3/uL (ref 0.0–0.5)
Eosinophils Relative: 0 %
HCT: 18.5 % — ABNORMAL LOW (ref 39.0–52.0)
Hemoglobin: 6.4 g/dL — CL (ref 13.0–17.0)
Immature Granulocytes: 1 %
Lymphocytes Relative: 4 %
Lymphs Abs: 0.2 10*3/uL — ABNORMAL LOW (ref 0.7–4.0)
MCH: 37.2 pg — ABNORMAL HIGH (ref 26.0–34.0)
MCHC: 34.6 g/dL (ref 30.0–36.0)
MCV: 107.6 fL — ABNORMAL HIGH (ref 80.0–100.0)
Monocytes Absolute: 0.5 10*3/uL (ref 0.1–1.0)
Monocytes Relative: 8 %
Neutro Abs: 4.7 10*3/uL (ref 1.7–7.7)
Neutrophils Relative %: 87 %
Platelets: 149 10*3/uL — ABNORMAL LOW (ref 150–400)
RBC: 1.72 MIL/uL — ABNORMAL LOW (ref 4.22–5.81)
RDW: 13.8 % (ref 11.5–15.5)
WBC: 5.4 10*3/uL (ref 4.0–10.5)
nRBC: 0 % (ref 0.0–0.2)

## 2019-03-12 LAB — CBC
HCT: 28.3 % — ABNORMAL LOW (ref 39.0–52.0)
Hemoglobin: 9.9 g/dL — ABNORMAL LOW (ref 13.0–17.0)
MCH: 34.4 pg — ABNORMAL HIGH (ref 26.0–34.0)
MCHC: 35 g/dL (ref 30.0–36.0)
MCV: 98.3 fL (ref 80.0–100.0)
Platelets: 161 10*3/uL (ref 150–400)
RBC: 2.88 MIL/uL — ABNORMAL LOW (ref 4.22–5.81)
RDW: 17.3 % — ABNORMAL HIGH (ref 11.5–15.5)
WBC: 5.3 10*3/uL (ref 4.0–10.5)
nRBC: 0 % (ref 0.0–0.2)

## 2019-03-12 LAB — PREPARE RBC (CROSSMATCH)

## 2019-03-12 LAB — LACTIC ACID, PLASMA
Lactic Acid, Venous: 1.1 mmol/L (ref 0.5–1.9)
Lactic Acid, Venous: 1.2 mmol/L (ref 0.5–1.9)

## 2019-03-12 LAB — FOLATE RBC
Folate, Hemolysate: 207 ng/mL
Folate, RBC: 877 ng/mL (ref >498)
Hematocrit: 23.6 % — ABNORMAL LOW (ref 37.5–51.0)

## 2019-03-12 MED ORDER — SODIUM CHLORIDE 0.9% IV SOLUTION: Freq: Once | INTRAVENOUS | Status: DC

## 2019-03-12 MED ORDER — SODIUM CHLORIDE 0.9% IV SOLUTION
Freq: Once | INTRAVENOUS | Status: AC
  Administered 2019-03-12: 06:00:00 via INTRAVENOUS

## 2019-03-12 NOTE — Progress Notes (Signed)
[facility] agency unable to accept 2/2 staffing. Referral made with [facility]. [facility] accepted pt for home health services. Whitman Hero RN,BSN,CM

## 2019-03-12 NOTE — Progress Notes (Signed)
NCM spoke with pt and pt's wife regarding PT's evaluation / recommendations: Home health PT. Both agreeable to home health services. Choice given. [facility] selected. Referral made to [facility] for home health services. Whitman Hero RN,BSN,CM [phone]

## 2019-03-12 NOTE — Progress Notes (Signed)
CRITICAL VALUE ALERT  Critical Value:  Hemoglobin 6.4  Date & Time Notied:  03/12/19 at 0458  Provider Notified: Schorr, NP at 0500  Orders Received/Actions taken: New order for blood transfusion placed.

## 2019-03-12 NOTE — TOC Initial Note (Addendum)
Transition of Care [facility]) - Initial/Assessment Note    Patient Details  Name: Nathan Sellers MRN: [id number] Date of Birth: 1946-02-23  [facility] CM/SW Contact:    Sharin Mons, RN Phone Number: 03/12/2019, 9:46 AM  Clinical Narrative:  Admitted s/p fall, suffered L femur fx. Hx of COPD, hypertension, PVD and CAD. From home with wife.       -7/13 s/p Left total hip arthroplasty  Awaiting PT evaluation .... NCM to f/u with TOC needs.   Pt gave NCM ok to speak with wife about d/c planning. Wife verbalized if pt needs SNF rehab. she's ok with that plan however her preference is for husband to come home with home health services.    BRAYLON GRENDA (Spouse)     [phone]      PCP: Grayland Ormond PA  Expected Discharge Plan: Skilled Nursing Facility Barriers to Discharge: Continued Medical Work up   Patient Goals and CMS Choice Patient states their goals for this hospitalization and ongoing recovery are:: to get better CMS Medicare.gov Compare Post Acute Care list provided to:: Patient Choice offered to / list presented to : Patient  Expected Discharge Plan and Services Expected Discharge Plan: [place]   Discharge Planning Services: CM Consult   Living arrangements for the past 2 months: [place] Agency: [facility](Selected if home health services are needed @ d/c)        Prior Living Arrangements/Services Living arrangements for the past 2 months: [place] with:: Spouse Patient language and need for interpreter reviewed:: Yes Do you feel safe going back to the place where you live?: Yes      Need for Family Participation in Patient Care: Yes (Comment) Care giver support system in place?: Yes (comment) Current home services: (owns cane, walker) Criminal Activity/Legal Involvement Pertinent to Current Situation/Hospitalization: No - Comment as  needed  Activities of Daily Living      Permission Sought/Granted Permission sought to share information with : Case Manager, Family Supports Permission granted to share information with : Yes, Verbal Permission Granted  Share Information with NAME: TRYGVE THAL     Permission granted to share info w Relationship: wife  Permission granted to share info w Contact Information: [phone]  Emotional Assessment Appearance:: Appears stated age Attitude/Demeanor/Rapport: Gracious Affect (typically observed): Accepting Orientation: : Oriented to Self, Oriented to Place, Oriented to  Time, Oriented to Situation Alcohol / Substance Use: Alcohol Use([place], 2-3  glasses per day, ? oz) Psych Involvement: No (comment)  Admission diagnosis:  Weakness; Urinary Retention Patient Active Problem List   Diagnosis Date Noted  . Left displaced femoral neck fracture ([place]) 03/11/2019  . Essential hypertension 03/11/2019  . Multiple falls 03/11/2019  . Chronic alcoholism ([place]) 03/11/2019  . CKD (chronic kidney disease), stage III ([place]) 03/11/2019  . AKI (acute kidney injury) ([place]) 03/11/2019  . Hyponatremia 07/25/2018   PCP:  Donnamarie Rossetti, PA-C Pharmacy:   [facility] [address], [place] - [place] [address] [place] [address] Phone: [phone] Fax: [phone]     Social Determinants of Health (SDOH) Interventions    Readmission Risk Interventions No flowsheet data found.

## 2019-03-12 NOTE — Evaluation (Signed)
Physical Therapy Evaluation Patient Details Name: Nathan Sellers MRN: [id number] DOB: 09/07/1945 Today's Date: 03/12/2019   History of Present Illness  Patient is a 73 year old male admitted after fall. His fall was last Thursday and he had been unable to ambulate. patient found to have femoral neck fx, he is now s/p left THA, anterior approach. PMH to include: COPD, HTN, multiple falls, HOH, CKD, ETOH.    Clinical Impression  Patient received in bed with NT present assisting with bath. Patient agrees to PT eval. Patient is very [place]. Bed mobility performed with supervision, increased time and use of bed rails. Patient currently receiving blood transfusion, therefore ambulation distance limited from bed to recliner. Patient transfers with min guard assist and is able to ambulate with step to pattern and antalgic gait over to recliner. Patient will benefit from continued skilled PT while here to improve strength, functional mobility and independence for return home at discharge.      Follow Up Recommendations Home health PT    Equipment Recommendations  Rolling walker with 5" wheels    Recommendations for Other Services       Precautions / Restrictions Precautions Precautions: Fall;Anterior Hip Precaution Booklet Issued: No Restrictions Weight Bearing Restrictions: Yes LLE Weight Bearing: Weight bearing as tolerated      Mobility  Bed Mobility Overal bed mobility: Modified Independent             General bed mobility comments: increased time, use of bed rails  Transfers Overall transfer level: Needs assistance Equipment used: Rolling walker (2 wheeled) Transfers: Sit to/from Stand Sit to Stand: Min assist            Ambulation/Gait Ambulation/Gait assistance: Min guard Gait Distance (Feet): 5 Feet Assistive device: Rolling walker (2 wheeled) Gait Pattern/deviations: Step-to pattern;Decreased step length - right;Decreased step length - left;Decreased stance time -  left;Antalgic     General Gait Details: due to patient receiving blood transfusion, gait limited to chair only.  Stairs            Wheelchair Mobility    Modified Rankin (Stroke Patients Only)       Balance Overall balance assessment: Needs assistance Sitting-balance support: Single extremity supported;Feet supported Sitting balance-Leahy Scale: Good     Standing balance support: Bilateral upper extremity supported Standing balance-Leahy Scale: Fair                               Pertinent Vitals/Pain Pain Assessment: Faces Faces Pain Scale: Hurts a little bit Pain Descriptors / Indicators: Aching;Operative site guarding Pain Intervention(s): Limited activity within patient's tolerance;Monitored during session    [place] expects to be discharged to:: Private residence Living Arrangements: Spouse/significant other Available Help at Discharge: Family;Available 24 hours/day             Additional Comments: unsure of home environment, patient is very HOH    Prior Function Level of Independence: Independent               Hand Dominance        Extremity/Trunk Assessment   Upper Extremity Assessment Upper Extremity Assessment: Overall WFL for tasks assessed    Lower Extremity Assessment Lower Extremity Assessment: Overall WFL for tasks assessed;LLE deficits/detail LLE Coordination: decreased fine motor;decreased gross motor       Communication   Communication: HOH  Cognition Arousal/Alertness: Awake/alert Behavior During Therapy: WFL for tasks assessed/performed Overall Cognitive Status: Within Functional  Limits for tasks assessed                                        General Comments      Exercises     Assessment/Plan    PT Assessment Patient needs continued PT services  PT Problem List Decreased strength;Decreased mobility;Decreased safety awareness;Decreased activity  tolerance;Decreased balance;Decreased knowledge of use of DME;Pain;Decreased knowledge of precautions       PT Treatment Interventions DME instruction;Therapeutic activities;Gait training;Therapeutic exercise;Patient/family education;Balance training;Functional mobility training;Neuromuscular re-education;Stair training    PT Goals (Current goals can be found in the Care Plan section)  Acute Rehab PT Goals Patient Stated Goal: to return home PT Goal Formulation: With patient/family Time For Goal Achievement: 03/19/19 Potential to Achieve Goals: Good    Frequency BID   Barriers to discharge        Co-evaluation               AM-PAC PT "6 Clicks" Mobility  Outcome Measure Help needed turning from your back to your side while in a flat bed without using bedrails?: A Little Help needed moving from lying on your back to sitting on the side of a flat bed without using bedrails?: A Little Help needed moving to and from a bed to a chair (including a wheelchair)?: A Little Help needed standing up from a chair using your arms (e.g., wheelchair or bedside chair)?: A Little Help needed to walk in hospital room?: A Little Help needed climbing 3-5 steps with a railing? : A Lot 6 Click Score: 17    End of Session Equipment Utilized During Treatment: Gait belt;Oxygen Activity Tolerance: Patient tolerated treatment well Patient left: in chair;with chair alarm set;with call bell/phone within reach;with SCD's reapplied Nurse Communication: Mobility status PT Visit Diagnosis: Muscle weakness (generalized) (M62.81);Difficulty in walking, not elsewhere classified (R26.2);Pain;History of falling (Z91.81) Pain - Right/Left: Left Pain - part of body: Hip    Time: 1100-1125 PT Time Calculation (min) (ACUTE ONLY): 25 min   Charges:   PT Evaluation $PT Eval Low Complexity: 1 Low PT Treatments $Therapeutic Activity: 8-22 mins        Joella Saefong, PT, GCS 03/12/19,1:28 PM

## 2019-03-12 NOTE — Progress Notes (Signed)
PROGRESS NOTE    Nathan Sellers  WJX:[id number] DOB: 1945-11-02 DOA: 03/10/2019 PCP: Donnamarie Rossetti, PA-C   Brief Narrative:  Nathan Sellers is a 73 y.o. male with medical history significant for COPD, hypertension, peripheral vascular disease and coronary artery disease, presented to the ED with complaints of multiple falls, last fall was thursday, with bruising of his extremities.  He reported left hip pain and he was unable to ambulate..  Patient is extremely hard of hearing.  He has chronic shortness of breath.  Per wife, he had no other complaint however he has some productive cough lately.  Reportedly he drinks daily and drinks about 2 to 3 glasses of wine.  He is noncompliant with his antihypertensives.  Upon initial presentation to emergency department, he was tachycardic to 107, tachypneic 26-42, blood pressure systolic 10 7-1 64.  O2 sats greater than 93% on room air.  Sodium low 121, with low bicarb 16, with anion gap of 16, creatinine elevated at 2, blood alcohol level less than 10, Tylenol and salicylate levels unremarkable.  Lactic acid 2.3 >> 1.6.  Normal TSH.  UA with moderate hemoglobin, no bacteria.  Negative high-sensitivity troponin x2.  UDS Clean.  Hgb 9.7 down from 12.6 eight months ago.  Head CT negative for acute abnormality.  Chest x-ray right lower lobe pulmonary nodule.  Left forearm x-ray - distal soft tissue swelling.  No fracture. Hip xray- shows a mildly displaced left femoral neck fracture.  Dr. Veverly Fells on call for Ortho at [facility] consulted recommended transfer over to [facility].  Hospitalist admitted patient.  Underwent left knee surgery on 03/11/2019.  Consultants:   Orthopedics  Procedures:   None  Antimicrobials:   None   Subjective: Patient seen and examined.  He is feeling much better today.  Left hip pain is also controlled.  No new complaint.  Objective: Vitals:   03/12/19 0547 03/12/19 0606 03/12/19 0840 03/12/19 1034  BP: 140/69 (!)  143/59 (!) 125/57 119/73  Pulse: (!) 106 90 92 96  Resp: 18 18  18   Temp: 97.9 F (36.6 C) 98.2 F (36.8 C) 98.1 F (36.7 C) 98.2 F (36.8 C)  TempSrc: Oral Oral Oral Oral  SpO2: 99% 100% 96% 96%  Weight:      Height:        Intake/Output Summary (Last 24 hours) at 03/12/2019 1056 Last data filed at 03/12/2019 0830 Gross per 24 hour  Intake 3018.83 ml  Output 850 ml  Net 2168.83 ml   Filed Weights   03/10/19 1042 03/11/19 1156  Weight: 59 kg 59 kg    Examination: General exam: Appears calm and comfortable  Respiratory system: Clear to auscultation. Respiratory effort normal. Cardiovascular system: S1 & S2 heard, RRR. No JVD, murmurs, rubs, gallops or clicks. No pedal edema. Gastrointestinal system: Abdomen is nondistended, soft and nontender. No organomegaly or masses felt. Normal bowel sounds heard. Central nervous system: Alert and oriented. No focal neurological deficits. Extremities: Symmetric 5 x 5 power. Skin: No rashes, lesions or ulcers.  Hip incision is clean and intact with dressing in place.  Has large bruise in the left upper extremity involving almost 80% of the left upper extremity and also in the right upper extremity more than 30 to 40%.  Nontender. Psychiatry: Judgement and insight appear normal. Mood & affect appropriate.   Data Reviewed: I have personally reviewed following labs and imaging studies  CBC: Recent Labs  Lab 03/10/19 1115 03/11/19 0715 03/12/19 2130  WBC 8.5 5.6 5.4  NEUTROABS 7.9*  --  4.7  HGB 9.7* 8.7* 6.4*  HCT 27.5* 24.8* 18.5*  MCV 106.2* 105.5* 107.6*  PLT 193 174 149*   Basic Metabolic Panel: Recent Labs  Lab 03/10/19 1646 03/10/19 2036 03/11/19 0251 03/11/19 0715 03/11/19 1036 03/12/19 0328  NA 122* 122* 124* 126*  --  132*  K 4.3 4.0 4.1 3.8  --  4.1  CL 93* 94* 97* 97*  --  103  CO2 16* 14* 16* 15*  --  17*  GLUCOSE 90 81 80 82  --  115*  BUN 33* 31* 29* 28*  --  26*  CREATININE 1.80* 1.92* 1.74* 1.70*  --   1.46*  CALCIUM 7.7* 7.8* 7.9* 8.0*  --  8.0*  MG 1.5*  --   --   --  2.1  --   PHOS 3.4  --   --   --   --   --    GFR: Estimated Creatinine Clearance: 38.2 mL/min (A) (by C-G formula based on SCr of 1.46 mg/dL (H)). Liver Function Tests: Recent Labs  Lab 03/10/19 1115 03/12/19 0328  AST 28 33  ALT 18 18  ALKPHOS 77 42  BILITOT 1.7* 1.0  PROT 6.8 5.0*  ALBUMIN 3.4* 2.7*   Recent Labs  Lab 03/10/19 1115  LIPASE 33   Recent Labs  Lab 03/10/19 1115  AMMONIA 18   Coagulation Profile: Recent Labs  Lab 03/10/19 1115  INR 1.1   Cardiac Enzymes: No results for input(s): CKTOTAL, CKMB, CKMBINDEX, TROPONINI in the last 168 hours. BNP (last 3 results) No results for input(s): PROBNP in the last 8760 hours. HbA1C: No results for input(s): HGBA1C in the last 72 hours. CBG: No results for input(s): GLUCAP in the last 168 hours. Lipid Profile: No results for input(s): CHOL, HDL, LDLCALC, TRIG, CHOLHDL, LDLDIRECT in the last 72 hours. Thyroid Function Tests: Recent Labs    03/10/19 1118  TSH 1.518   Anemia Panel: Recent Labs    03/11/19 0715 03/11/19 1036  VITAMINB12  --  76*  FERRITIN 318  --   TIBC 249*  --   IRON 47  --    Sepsis Labs: Recent Labs  Lab 03/10/19 1115 03/10/19 1244  LATICACIDVEN 2.3* 1.6    Recent Results (from the past 240 hour(s))  SARS Coronavirus 2 (CEPHEID- Performed in [facility] lab), Hosp Order     Status: None   Collection Time: 03/10/19 11:31 AM   Specimen: Nasopharyngeal Swab  Result Value Ref Range Status   SARS Coronavirus 2 NEGATIVE NEGATIVE Final    Comment: (NOTE) If result is NEGATIVE SARS-CoV-2 target nucleic acids are NOT DETECTED. The SARS-CoV-2 RNA is generally detectable in upper and lower  respiratory specimens during the acute phase of infection. The lowest  concentration of SARS-CoV-2 viral copies this assay can detect is 250  copies / mL. A negative result does not preclude SARS-CoV-2 infection    and should not be used as the sole basis for treatment or other  patient management decisions.  A negative result may occur with  improper specimen collection / handling, submission of specimen other  than nasopharyngeal swab, presence of viral mutation(s) within the  areas targeted by this assay, and inadequate number of viral copies  (<250 copies / mL). A negative result must be combined with clinical  observations, patient history, and epidemiological information. If result is POSITIVE SARS-CoV-2 target nucleic acids are DETECTED. The SARS-CoV-2 RNA  is generally detectable in upper and lower  respiratory specimens dur ing the acute phase of infection.  Positive  results are indicative of active infection with SARS-CoV-2.  Clinical  correlation with patient history and other diagnostic information is  necessary to determine patient infection status.  Positive results do  not rule out bacterial infection or co-infection with other viruses. If result is PRESUMPTIVE POSTIVE SARS-CoV-2 nucleic acids MAY BE PRESENT.   A presumptive positive result was obtained on the submitted specimen  and confirmed on repeat testing.  While 2019 novel coronavirus  (SARS-CoV-2) nucleic acids may be present in the submitted sample  additional confirmatory testing may be necessary for epidemiological  and / or clinical management purposes  to differentiate between  SARS-CoV-2 and other Sarbecovirus currently known to infect humans.  If clinically indicated additional testing with an alternate test  methodology [phone] is advised. The SARS-CoV-2 RNA is generally  detectable in upper and lower respiratory sp ecimens during the acute  phase of infection. The expected result is Negative. Fact Sheet for Patients:  BoilerBrush.com.cyhttps://www.fda.gov/media/136312/download Fact Sheet for Healthcare Providers: https://pope.com/https://www.fda.gov/media/136313/download This test is not yet approved or cleared by the [place] FDA  and has been authorized for detection and/or diagnosis of SARS-CoV-2 by FDA under an Emergency Use Authorization (EUA).  This EUA will remain in effect (meaning this test can be used) for the duration of the COVID-19 declaration under Section 564(b)(1) of the Act, 21 U.S.C. section 360bbb-3(b)(1), unless the authorization is terminated or revoked sooner. Performed at [facility], [address] [place], [place] [address]   Surgical pcr screen     Status: None   Collection Time: 03/11/19  9:01 AM   Specimen: Nasal Mucosa; Nasal Swab  Result Value Ref Range Status   MRSA, PCR NEGATIVE NEGATIVE Final   Staphylococcus aureus NEGATIVE NEGATIVE Final    Comment: (NOTE) The Xpert SA Assay (FDA approved for NASAL specimens in patients 72 years of age and older), is one component of a comprehensive surveillance program. It is not intended to diagnose infection nor to guide or monitor treatment. Performed at [facility], [address] [place], [place] [address]       Radiology Studies: Dg Pelvis 1-2 Views  Result Date: 03/10/2019 CLINICAL DATA:  Fall yesterday. Pelvis and left hip pain. EXAM: PELVIS - 1-2 VIEW COMPARISON:  None. FINDINGS: A mildly displaced left femoral neck fracture is seen. No evidence of hip dislocation. No evidence of pelvic fracture. Extensive atherosclerotic calcification involving the iliac arteries. IMPRESSION: Mildly displaced left femoral neck fracture. Electronically Signed   By: Danae OrleansJohn A Stahl M.D.   On: 03/10/2019 14:23   Dg Forearm Left  Result Date: 03/10/2019 CLINICAL DATA:  Fall yesterday. Left forearm pain and bruising. Initial encounter. EXAM: LEFT FOREARM - 2 VIEW COMPARISON:  None. FINDINGS: There is no evidence of fracture or other focal bone lesions. Moderate soft tissue swelling is seen involving the distal forearm. Mild peripheral vascular calcification also seen. IMPRESSION: Distal forearm soft tissue swelling. No evidence of fracture.  Electronically Signed   By: Danae OrleansJohn A Stahl M.D.   On: 03/10/2019 14:21   Ct Head Wo Contrast  Result Date: 03/10/2019 CLINICAL DATA:  Recent fall. Minor blunt head trauma. Initial encounter. EXAM: CT HEAD WITHOUT CONTRAST TECHNIQUE: Contiguous axial images were obtained from the base of the skull through the vertex without intravenous contrast. COMPARISON:  None. FINDINGS: Brain: No evidence of acute infarction, hemorrhage, hydrocephalus, extra-axial collection, or mass lesion/mass effect.  Mild diffuse cerebral atrophy and chronic small vessel disease. Vascular:  No hyperdense vessel or other acute findings. Skull: No evidence of fracture or other significant bone abnormality. Sinuses/Orbits:  No acute findings. Other: None. IMPRESSION: No acute intracranial abnormality. Mild cerebral atrophy and chronic small vessel disease. Electronically Signed   By: Danae Orleans M.D.   On: 03/10/2019 14:07   Pelvis Portable  Result Date: 03/11/2019 CLINICAL DATA:  Status post total left hip replacement. EXAM: PORTABLE PELVIS 1-2 VIEWS COMPARISON:  03/10/2019 FINDINGS: Post recent left hip arthroplasty. Normal alignment of the hardware components. Expected soft tissue emphysema and edema. IMPRESSION: Post recent left hip arthroplasty without evidence of immediate complications. Electronically Signed   By: Ted Mcalpine M.D.   On: 03/11/2019 14:48   Dg Chest Portable 1 View  Result Date: 03/10/2019 CLINICAL DATA:  Shortness of breath. Dyspnea. COPD. Recent falls. EXAM: PORTABLE CHEST 1 VIEW COMPARISON:  07/25/2018 FINDINGS: The heart size and mediastinal contours are within normal limits. Aortic atherosclerosis. No evidence of acute pulmonary infiltrate or edema. No evidence of pleural effusion. A small nodular density is seen in right lower lobe, as seen on recent PET-CT. The visualized skeletal structures are unremarkable. IMPRESSION: 1. No acute findings. 2. Right lower lobe pulmonary nodule, as seen on recent  PET-CT. Electronically Signed   By: Danae Orleans M.D.   On: 03/10/2019 14:20   Dg Hip Operative Unilat W Or W/o Pelvis Left  Result Date: 03/11/2019 CLINICAL DATA:  LEFT hip arthroplasty EXAM: OPERATIVE LEFT HIP (WITH PELVIS IF PERFORMED) 3 VIEWS TECHNIQUE: Fluoroscopic spot image(s) were submitted for interpretation post-operatively. COMPARISON:  03/10/2019 FINDINGS: LEFT total hip arthroplasty identified without complicating features. IMPRESSION: LEFT total hip arthroplasty without complicating features Electronically Signed   By: Harmon Pier M.D.   On: 03/11/2019 13:50    Scheduled Meds:  sodium chloride   Intravenous Once   aspirin  81 mg Oral BID WC   atorvastatin  40 mg Oral Daily   docusate sodium  100 mg Oral BID   folic acid  1 mg Oral Daily   ipratropium-albuterol  3 mL Nebulization Once   multivitamin with minerals  1 tablet Oral Daily   thiamine  100 mg Oral Daily   Or   thiamine  100 mg Intravenous Daily   Continuous Infusions:    LOS: 2 days   Assessment & Plan:   Active Problems:   Hyponatremia   Left displaced femoral neck fracture (HCC)   Essential hypertension   Multiple falls   Chronic alcoholism (HCC)   CKD (chronic kidney disease), stage III (HCC)   AKI (acute kidney injury) (HCC)   Multiple falls/left femur fracture: His falls could be secondary to hyponatremia as well as chronic alcoholism.  Status post surgical fix of his left hip by orthopedics on 03/11/2019.  Postop day 1.  Doing well.  Continue pain medications.  Appreciate Ortho help.  PT OT consulted.  Acute blood loss anemia/postoperative anemia: Globin dropped to 6.4 after the surgery.  Likely acute blood loss anemia secondary to surgery.  Will transfuse him 1 unit of PRBC.  Repeat H&H after that.  Chronic alcoholism: No signs of withdrawal at this point in time.  CIWA has been 0 throughout.  Continue CIWA protocol with as needed Ativan and multivitamins.  Acute hyponatremia: Initial  sodium 121.  Currently 132  Work-up does not indicate SIADH.  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   COPD: Currently on only 2 L of oxygen.  Likely due to atelectasis.  No COPD exacerbation.  No wheezes.  Continue DuoNeb as needed.  Continue medications.  Encouraged incentive spirometry.  Pulmonary nodule: Will need follow-up imaging studies as outpatient.  Hypertension: Reportedly is noncompliant with his medications.  Reportedly his HCTZ was discontinued during last hospitalization but he continues to take it.  I will hold that for now.  His blood pressure is normal despite of being off of his antihypertensives so I will continue as it is and monitor for now.  Acute on chronic kidney disease stage III: Renal function back to his baseline.  Continue to watch.  Anion gap metabolic acidosis: Like be due to dehydration and AKI.  Resolved.  Hyperlipidemia: Continue home statins.  DVT prophylaxis: Lovenox which is on hold due to surgery today. Code Status: DNR Family Communication: None present at bedside.  Called patient's wife Antonietta BarcelonaDoris Barnes and updated her about him.  He had further questions about the type of surgery he had.  She would like to hear from orthopedics. Disposition Plan: To be determined, will likely need SNF in next 1 to 2 days.   Time spent: 30 minutes   Hughie Clossavi Antwoine Zorn, MD [facility] Pager [phone]  If 7PM-7AM, please contact night-coverage [email or web address] Password [email or web address] 03/12/2019, 10:56 AM

## 2019-03-12 NOTE — Progress Notes (Signed)
Given wife update over the phone.

## 2019-03-12 NOTE — Progress Notes (Signed)
Physical Therapy Treatment Patient Details Name: Nathan Sellers MRN: [id number] DOB: [birth year] Today's Date: 03/12/2019    History of Present Illness Patient is a 73 year old male admitted after fall. His fall was last Thursday and he had been unable to ambulate. patient found to have femoral neck fx, he is now s/p left THA, anterior approach. PMH to include: COPD, HTN, multiple falls, HOH, CKD, ETOH.    PT Comments    Patient received in recliner, agrees to LE exercises and walking. Patient is very [place], therefore requires tactile/visual cues for exercises. Is able to transfer from sit to stand with min guard. Ambulated 35 feet with RW, WBAT L LE. Good weight shifting, narrow base of support and decreased foot clearance. Patient is able to return to supine from seated position without assist. He is making good progress and will continue to benefit from skilled PT while here to improve strength, functional mobility, independence and safety.         Follow Up Recommendations  Home health PT     Equipment Recommendations  None recommended by PT patient states he has walkers at home.    Recommendations for Other Services       Precautions / Restrictions Precautions Precautions: Anterior Hip;Fall Precaution Booklet Issued: No Restrictions Weight Bearing Restrictions: Yes LLE Weight Bearing: Weight bearing as tolerated    Mobility  Bed Mobility Overal bed mobility: Modified Independent             General bed mobility comments: increased time, use of bed rails  Transfers Overall transfer level: Modified independent Equipment used: Rolling walker (2 wheeled) Transfers: Sit to/from Stand Sit to Stand: Modified independent (Device/Increase time);Supervision            Ambulation/Gait Ambulation/Gait assistance: Min guard Gait Distance (Feet): 35 Feet Assistive device: Rolling walker (2 wheeled) Gait Pattern/deviations: Shuffle;Narrow base of support     General Gait  Details: good safety and ability   Stairs             Wheelchair Mobility    Modified Rankin (Stroke Patients Only)       Balance Overall balance assessment: Modified Independent Sitting-balance support: Feet supported Sitting balance-Leahy Scale: Good     Standing balance support: Bilateral upper extremity supported Standing balance-Leahy Scale: Good                              Cognition Arousal/Alertness: Awake/alert Behavior During Therapy: WFL for tasks assessed/performed Overall Cognitive Status: Within Functional Limits for tasks assessed                                        Exercises Total Joint Exercises Ankle Circles/Pumps: AAROM;10 reps;Left Heel Slides: AAROM;Left;10 reps Hip ABduction/ADduction: AAROM;Left;10 reps    General Comments        Pertinent Vitals/Pain Pain Assessment: 0-10 Pain Score: 2  Faces Pain Scale: Hurts a little bit Pain Location: L hip Pain Descriptors / Indicators: Sore;Operative site guarding Pain Intervention(s): Monitored during session;Repositioned    Home Living Family/patient expects to be discharged to:: Private residence Living Arrangements: Spouse/significant other Available Help at Discharge: Family;Available 24 hours/day           Additional Comments: unsure of home environment, patient is very HOH    Prior Function Level of Independence: Independent  PT Goals (current goals can now be found in the care plan section) Acute Rehab PT Goals Patient Stated Goal: to return home PT Goal Formulation: With patient/family Time For Goal Achievement: 03/19/19 Potential to Achieve Goals: Good Progress towards PT goals: Progressing toward goals    Frequency    BID      PT Plan Current plan remains appropriate    Co-evaluation              AM-PAC PT "6 Clicks" Mobility   Outcome Measure  Help needed turning from your back to your side while in a flat  bed without using bedrails?: A Little Help needed moving from lying on your back to sitting on the side of a flat bed without using bedrails?: A Little Help needed moving to and from a bed to a chair (including a wheelchair)?: A Little Help needed standing up from a chair using your arms (e.g., wheelchair or bedside chair)?: A Little Help needed to walk in hospital room?: A Little Help needed climbing 3-5 steps with a railing? : A Little 6 Click Score: 18    End of Session Equipment Utilized During Treatment: Gait belt Activity Tolerance: Patient tolerated treatment well Patient left: in bed;with bed alarm set;with call bell/phone within reach Nurse Communication: Mobility status PT Visit Diagnosis: Muscle weakness (generalized) (M62.81);Difficulty in walking, not elsewhere classified (R26.2);Pain;History of falling (Z91.81) Pain - Right/Left: Left Pain - part of body: Hip     Time: 1345-1415 PT Time Calculation (min) (ACUTE ONLY): 30 min  Charges:  $Gait Training: 8-22 mins $Therapeutic Exercise: 8-22 mins $Therapeutic Activity: 8-22 mins                     Glyn Gerads, PT, GCS 03/12/19,2:51 PM

## 2019-03-13 LAB — CBC WITH DIFFERENTIAL/PLATELET
Abs Immature Granulocytes: 0.19 10*3/uL — ABNORMAL HIGH (ref 0.00–0.07)
Basophils Absolute: 0 10*3/uL (ref 0.0–0.1)
Basophils Relative: 1 %
Eosinophils Absolute: 0.1 10*3/uL (ref 0.0–0.5)
Eosinophils Relative: 1 %
HCT: 26.7 % — ABNORMAL LOW (ref 39.0–52.0)
Hemoglobin: 9.2 g/dL — ABNORMAL LOW (ref 13.0–17.0)
Immature Granulocytes: 4 %
Lymphocytes Relative: 11 %
Lymphs Abs: 0.5 10*3/uL — ABNORMAL LOW (ref 0.7–4.0)
MCH: 33.7 pg (ref 26.0–34.0)
MCHC: 34.5 g/dL (ref 30.0–36.0)
MCV: 97.8 fL (ref 80.0–100.0)
Monocytes Absolute: 0.6 10*3/uL (ref 0.1–1.0)
Monocytes Relative: 12 %
Neutro Abs: 3.4 10*3/uL (ref 1.7–7.7)
Neutrophils Relative %: 71 %
Platelets: 164 10*3/uL (ref 150–400)
RBC: 2.73 MIL/uL — ABNORMAL LOW (ref 4.22–5.81)
RDW: 18.3 % — ABNORMAL HIGH (ref 11.5–15.5)
WBC: 4.8 10*3/uL (ref 4.0–10.5)
nRBC: 0 % (ref 0.0–0.2)

## 2019-03-13 LAB — COMPREHENSIVE METABOLIC PANEL
ALT: 9 U/L (ref 0–44)
AST: 35 U/L (ref 15–41)
Albumin: 2.7 g/dL — ABNORMAL LOW (ref 3.5–5.0)
Alkaline Phosphatase: 44 U/L (ref 38–126)
Anion gap: 9 (ref 5–15)
BUN: 24 mg/dL — ABNORMAL HIGH (ref 8–23)
CO2: 19 mmol/L — ABNORMAL LOW (ref 22–32)
Calcium: 8.2 mg/dL — ABNORMAL LOW (ref 8.9–10.3)
Chloride: 107 mmol/L (ref 98–111)
Creatinine, Ser: 1.29 mg/dL — ABNORMAL HIGH (ref 0.61–1.24)
GFR calc Af Amer: >60 mL/min (ref >60)
GFR calc non Af Amer: 55 mL/min — ABNORMAL LOW (ref >60)
Glucose, Bld: 102 mg/dL — ABNORMAL HIGH (ref 70–99)
Potassium: 3.7 mmol/L (ref 3.5–5.1)
Sodium: 135 mmol/L (ref 135–145)
Total Bilirubin: 1.2 mg/dL (ref 0.3–1.2)
Total Protein: 5.2 g/dL — ABNORMAL LOW (ref 6.5–8.1)

## 2019-03-13 LAB — TYPE AND SCREEN
ABO/RH(D): O NEG
Antibody Screen: POSITIVE
Donor AG Type: NEGATIVE
Donor AG Type: NEGATIVE
PT AG Type: NEGATIVE
Unit division: 0
Unit division: 0

## 2019-03-13 LAB — VITAMIN B1: Vitamin B1 (Thiamine): 79 nmol/L (ref 66.5–200.0)

## 2019-03-13 LAB — BPAM RBC
Blood Product Expiration Date: 202008012359
Blood Product Expiration Date: 202008012359
ISSUE DATE / TIME: 202007140541
ISSUE DATE / TIME: 202007141025
Unit Type and Rh: 9500
Unit Type and Rh: 9500

## 2019-03-13 MED ORDER — FOLIC ACID 1 MG PO TABS: 1.0000 mg | ORAL_TABLET | Freq: Every day | ORAL | 0 refills | Status: AC

## 2019-03-13 MED ORDER — HYDROCODONE-ACETAMINOPHEN 5-325 MG PO TABS: 1.0000 | ORAL_TABLET | ORAL | 0 refills | Status: AC | PRN

## 2019-03-13 MED ORDER — THIAMINE HCL 100 MG PO TABS: 100.0000 mg | ORAL_TABLET | Freq: Every day | ORAL | 0 refills | Status: AC

## 2019-03-13 MED ORDER — ASPIRIN 81 MG PO CHEW: 81.0000 mg | CHEWABLE_TABLET | Freq: Two times a day (BID) | ORAL | 0 refills | Status: AC

## 2019-03-13 MED ORDER — DOCUSATE SODIUM 100 MG PO CAPS: 100.0000 mg | ORAL_CAPSULE | Freq: Two times a day (BID) | ORAL | 0 refills | Status: AC

## 2019-03-13 NOTE — Progress Notes (Signed)
    Subjective:  Patient reports pain as mild to moderate.  Denies N/V/CP/SOB. Plan for d/c today  Objective:   VITALS:   Vitals:   03/12/19 1511 03/12/19 2202 03/13/19 0557 03/13/19 1333  BP: 117/83 138/85 (!) 165/98 125/80  Pulse: 91 91 98 91  Resp: 16   (!) 21  Temp: 98.3 F (36.8 C) 98.4 F (36.9 C) 98.7 F (37.1 C) 98.1 F (36.7 C)  TempSrc:      SpO2: 100% 98% 97% 100%  Weight:      Height:        NAD ABD soft Sensation intact distally Intact pulses distally Dorsiflexion/Plantar flexion intact Incision: moderate drainage Compartment soft   Lab Results  Component Value Date   WBC 4.8 03/13/2019   HGB 9.2 (L) 03/13/2019   HCT 26.7 (L) 03/13/2019   MCV 97.8 03/13/2019   PLT 164 03/13/2019   BMET    Component Value Date/Time   NA 135 03/13/2019 0258   K 3.7 03/13/2019 0258   CL 107 03/13/2019 0258   CO2 19 (L) 03/13/2019 0258   GLUCOSE 102 (H) 03/13/2019 0258   BUN 24 (H) 03/13/2019 0258   CREATININE 1.29 (H) 03/13/2019 0258   CALCIUM 8.2 (L) 03/13/2019 0258   GFRNONAA 55 (L) 03/13/2019 0258   GFRAA >60 03/13/2019 0258     Assessment/Plan: 2 Days Post-Op   Active Problems:   Hyponatremia   Left displaced femoral neck fracture (HCC)   Essential hypertension   Multiple falls   Chronic alcoholism (HCC)   CKD (chronic kidney disease), stage III (HCC)   AKI (acute kidney injury) ([place])   WBAT with walker DVT ppx: Aspirin, SCDs, TEDS PO pain control PT/OT Dispo: Will change Aquacel, ok for d/c home, f/u in 1 week    Bertram Savin 03/13/2019, 3:22 PM   Rod Can, MD Cell: [phone] [place] [facility] [place]  [place] [address], [place], [place], [place] [address] Phone: [phone] [email or web address] Facebook  [employer]

## 2019-03-13 NOTE — Progress Notes (Signed)
Wife notified 5W that oxygen had been delivered to floor. RN called after hours PTAR and scheduled pt's transportation. RN removed pt's IVs. RN reviewed discharge education with pt - he verbalized understanding. RN highlighted key points of AVS for family.

## 2019-03-13 NOTE — Progress Notes (Signed)
Physical Therapy Treatment Patient Details Name: Nathan Sellers MRN: [id number] DOB: Jul 05, 1946 Today's Date: 03/13/2019    History of Present Illness Patient is a 73 year old male admitted after fall. His fall was last Thursday and he had been unable to ambulate. patient found to have femoral neck fx, he is now s/p left THA, anterior approach. PMH to include: COPD, HTN, multiple falls, HOH, CKD, ETOH.    PT Comments    Pt states "I don't want to over do it, I'm going home".  PT able to encourage pt to review written HEP for hip strengthening exercises. Pt agrees. Pt states he is ready to d/c home with HHPT services and understands recommendation for use of RW at all times.   Follow Up Recommendations  Home health PT     Equipment Recommendations  None recommended by PT    Recommendations for Other Services       Precautions / Restrictions Precautions Precautions: Anterior Hip;Fall Restrictions LLE Weight Bearing: Weight bearing as tolerated    Mobility  Bed Mobility               General bed mobility comments: increased time, use of bed rails  Transfers Overall transfer level: Needs assistance Equipment used: Rolling walker (2 wheeled) Transfers: Sit to/from Stand Sit to Stand: Supervision            Ambulation/Gait Ambulation/Gait assistance: Supervision Gait Distance (Feet): 50 Feet Assistive device: Rolling walker (2 wheeled)       General Gait Details: good safety and ability   Stairs             Wheelchair Mobility    Modified Rankin (Stroke Patients Only)       Balance                                            Cognition Arousal/Alertness: Awake/alert Behavior During Therapy: WFL for tasks assessed/performed Overall Cognitive Status: Within Functional Limits for tasks assessed                                        Exercises General Exercises - Lower Extremity Ankle Circles/Pumps: 5  reps;Both Short Arc Quad: 5 reps;Both Long Arc Quad: 20 reps;Both Heel Slides: Both;5 reps Hip ABduction/ADduction: Both;5 reps Hip Flexion/Marching: 20 reps;Both    General Comments        Pertinent Vitals/Pain Faces Pain Scale: No hurt Pain Location: L hip Pain Descriptors / Indicators: Aching Pain Intervention(s): Monitored during session    Home Living                      Prior Function            PT Goals (current goals can now be found in the care plan section) Progress towards PT goals: Progressing toward goals    Frequency    BID      PT Plan Current plan remains appropriate    Co-evaluation              AM-PAC PT "6 Clicks" Mobility   Outcome Measure  Help needed turning from your back to your side while in a flat bed without using bedrails?: A Little Help needed moving from lying on your back to sitting on  the side of a flat bed without using bedrails?: A Little Help needed moving to and from a bed to a chair (including a wheelchair)?: A Little Help needed standing up from a chair using your arms (e.g., wheelchair or bedside chair)?: A Little Help needed to walk in hospital room?: A Little Help needed climbing 3-5 steps with a railing? : A Lot 6 Click Score: 17    End of Session Equipment Utilized During Treatment: Gait belt Activity Tolerance: Patient tolerated treatment well Patient left: in chair;with call bell/phone within reach   PT Visit Diagnosis: Muscle weakness (generalized) (M62.81);Difficulty in walking, not elsewhere classified (R26.2);Pain;History of falling (Z91.81) Pain - Right/Left: Left Pain - part of body: Hip     Time: [id number] PT Time Calculation (min) (ACUTE ONLY): 17 min  Charges:  $Gait Training: 8-22 mins $Therapeutic Exercise: 8-22 mins                    Reggy EyeKaren Malaya Cagley, PT, DPT   Kirby Argueta 03/13/2019, 1:30 PM

## 2019-03-13 NOTE — Progress Notes (Signed)
Patient pick up by PTAR. Patient is alert and oriented. Given patient's belonging to the patient.

## 2019-03-13 NOTE — Discharge Summary (Signed)
Physician Discharge Summary  [name]:[id number] DOB: 09/03/1945 DOA: 03/10/2019  PCP: Wilford Corner, PA-C  Admit date: 03/10/2019 Discharge date: 03/13/2019  Admitted From: Home Disposition:  Home with home health   Recommendations for Outpatient Follow-up:  1. Follow up with PCP in 1 week 2. Follow up with Dr. Linna Caprice in 2 weeks . 3. Follow-up outpatient for incidental finding of pulmonary nodules  Discharge Condition: Stable CODE STATUS: Full  Diet recommendation: Regular   Brief/Interim Summary: Nathan Melucci Buttsis a 73 y.o.malewith medical history significant forCOPD, hypertension, peripheral vascular disease and coronary artery disease, presented to the ED with complaints of multiple falls, last fall was thursday,with bruising of his extremities.He reported left hip pain and he was unable to ambulate.. Patient is extremely hard of hearing.  He has chronic shortness of breath.  Per wife, he had no other complaint however he has some productive cough lately.  Reportedly he drinks daily and drinks about 2 to 3 glasses of wine.  He is noncompliant with his antihypertensives.  Upon initial presentation to emergency department, he was tachycardic to 107, tachypneic 26-42, blood pressure systolic 10 7-1 64. O2 sats greater than 93% on room air. Sodium low 121, with low bicarb 16, with anion gap of 16, creatinine elevated at 2, blood alcohol level less than 10, Tylenol and salicylate levels unremarkable. Lactic acid 2.3 >>1.6.Normal TSH. UA with moderate hemoglobin, no bacteria.Negative high-sensitivity troponin x2. UDSClean.Hgb9.7 down from 12.6eightmonths ago. Head CT negative for acute abnormality. Chest x-ray right lower lobe pulmonary nodule. Left forearm x-ray -distal soft tissue swelling. No fracture. Hip xray-shows a mildly displaced left femoral neck fracture. Dr. Ranell Patrick on call for Ortho at [facility] consulted recommended transfer over to [facility]. Hospitalist admitted patient.   Patient underwent left hip arthroplasty.  He was seen by physical therapy who recommended home health physical therapy.  On day of discharge, he was feeling well, without complaints of severe pain.  Discharge Diagnoses:  Active Problems:   Hyponatremia   Left displaced femoral neck fracture (HCC)   Essential hypertension   Multiple falls   Chronic alcoholism (HCC)   CKD (chronic kidney disease), stage III (HCC)   AKI (acute kidney injury) (HCC)   Multiple falls/left femur fracture: His falls could be secondary to hyponatremia as well as chronic alcoholism.  Status post surgical fix of his left hip by orthopedics on 03/11/2019.  PT OT recommending home health  Acute blood loss anemia/postoperative anemia: Hgb dropped to 6.4 after the surgery.  Likely acute blood loss anemia secondary to surgery.    He was transfused 1 unit packed red blood cell.  Hemoglobin stable today 9.2  Chronic alcoholism: No signs of withdrawal at this point in time.   Acute hyponatremia: Work-up does not indicate SIADH.  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 Resolved  COPD: Currently on only 2 L of oxygen.  Likely due to atelectasis.  No COPD exacerbation.    Pulmonary nodule: Will need follow-up imaging studies as outpatient.  Hypertension: Reportedly is noncompliant with his medications.  Reportedly his HCTZ was discontinued during last hospitalization but he continues to take it.  I will hold that for now due to hyponatremia.  His blood pressure is normal despite of being off of his antihypertensives   Acute on chronic kidney disease stage III: Renal function back to his baseline.   Anion gap metabolic acidosis: Like be due to dehydration and AKI.  Resolved.  Hyperlipidemia: Continue home statins.  Discharge Instructions  Discharge Instructions    Call MD for:  difficulty breathing, headache or visual disturbances   Complete by: As directed    Call MD for:   extreme fatigue   Complete by: As directed    Call MD for:  hives   Complete by: As directed    Call MD for:  persistant dizziness or light-headedness   Complete by: As directed    Call MD for:  persistant nausea and vomiting   Complete by: As directed    Call MD for:  redness, tenderness, or signs of infection (pain, swelling, redness, odor or green/yellow discharge around incision site)   Complete by: As directed    Call MD for:  severe uncontrolled pain   Complete by: As directed    Call MD for:  temperature >100.4   Complete by: As directed    Diet - low sodium heart healthy   Complete by: As directed    Discharge instructions   Complete by: As directed    You were cared for by a hospitalist during your hospital stay. If you have any questions about your discharge medications or the care you received while you were in the hospital after you are discharged, you can call the unit and ask to speak with the hospitalist on call if the hospitalist that took care of you is not available. Once you are discharged, your primary care physician will handle any further medical issues. Please note that NO REFILLS for any discharge medications will be authorized once you are discharged, as it is imperative that you return to your primary care physician (or establish a relationship with a primary care physician if you do not have one) for your aftercare needs so that they can reassess your need for medications and monitor your lab values.   Increase activity slowly   Complete by: As directed      Allergies as of 03/13/2019      Reactions   Penicillins Hives      Medication List    STOP taking these medications   amLODipine 5 MG tablet Commonly known as: NORVASC   lisinopril-hydrochlorothiazide 20-12.5 MG tablet Commonly known as: ZESTORETIC     TAKE these medications   aspirin 81 MG chewable tablet Chew 1 tablet (81 mg total) by mouth 2 (two) times daily with a meal.   atorvastatin 40 MG  tablet Commonly known as: LIPITOR Take 40 mg by mouth daily.   docusate sodium 100 MG capsule Commonly known as: COLACE Take 1 capsule (100 mg total) by mouth 2 (two) times daily.   folic acid 1 MG tablet Commonly known as: FOLVITE Take 1 tablet (1 mg total) by mouth daily. Start taking on: March 14, 2019   HYDROcodone-acetaminophen 5-325 MG tablet Commonly known as: NORCO/VICODIN Take 1-2 tablets by mouth every 4 (four) hours as needed for up to 7 days for moderate pain.   thiamine 100 MG tablet Take 1 tablet (100 mg total) by mouth daily. Start taking on: March 14, 2019      Follow-up Information    Swinteck, Arlys JohnBrian, MD. Schedule an appointment as soon as possible for a visit in 2 weeks.   Specialty: Orthopedic Surgery Why: For wound re-check, For suture removal Contact information: [address] [place] [address] [phone]        Care, [facility] Follow up.   Why: Home health services arranged. Contact information: [address] [place] [address] [phone]  Allergies  Allergen Reactions  . Penicillins Hives    Consultations:  Orthopedic surgery   Procedures/Studies: Dg Pelvis 1-2 Views  Result Date: 03/10/2019 CLINICAL DATA:  Fall yesterday. Pelvis and left hip pain. EXAM: PELVIS - 1-2 VIEW COMPARISON:  None. FINDINGS: A mildly displaced left femoral neck fracture is seen. No evidence of hip dislocation. No evidence of pelvic fracture. Extensive atherosclerotic calcification involving the iliac arteries. IMPRESSION: Mildly displaced left femoral neck fracture. Electronically Signed   By: Danae Orleans M.D.   On: 03/10/2019 14:23   Dg Forearm Left  Result Date: 03/10/2019 CLINICAL DATA:  Fall yesterday. Left forearm pain and bruising. Initial encounter. EXAM: LEFT FOREARM - 2 VIEW COMPARISON:  None. FINDINGS: There is no evidence of fracture or other focal bone lesions. Moderate soft tissue swelling is  seen involving the distal forearm. Mild peripheral vascular calcification also seen. IMPRESSION: Distal forearm soft tissue swelling. No evidence of fracture. Electronically Signed   By: Danae Orleans M.D.   On: 03/10/2019 14:21   Ct Head Wo Contrast  Result Date: 03/10/2019 CLINICAL DATA:  Recent fall. Minor blunt head trauma. Initial encounter. EXAM: CT HEAD WITHOUT CONTRAST TECHNIQUE: Contiguous axial images were obtained from the base of the skull through the vertex without intravenous contrast. COMPARISON:  None. FINDINGS: Brain: No evidence of acute infarction, hemorrhage, hydrocephalus, extra-axial collection, or mass lesion/mass effect. Mild diffuse cerebral atrophy and chronic small vessel disease. Vascular:  No hyperdense vessel or other acute findings. Skull: No evidence of fracture or other significant bone abnormality. Sinuses/Orbits:  No acute findings. Other: None. IMPRESSION: No acute intracranial abnormality. Mild cerebral atrophy and chronic small vessel disease. Electronically Signed   By: Danae Orleans M.D.   On: 03/10/2019 14:07   Pelvis Portable  Result Date: 03/11/2019 CLINICAL DATA:  Status post total left hip replacement. EXAM: PORTABLE PELVIS 1-2 VIEWS COMPARISON:  03/10/2019 FINDINGS: Post recent left hip arthroplasty. Normal alignment of the hardware components. Expected soft tissue emphysema and edema. IMPRESSION: Post recent left hip arthroplasty without evidence of immediate complications. Electronically Signed   By: Ted Mcalpine M.D.   On: 03/11/2019 14:48   Dg Chest Portable 1 View  Result Date: 03/10/2019 CLINICAL DATA:  Shortness of breath. Dyspnea. COPD. Recent falls. EXAM: PORTABLE CHEST 1 VIEW COMPARISON:  07/25/2018 FINDINGS: The heart size and mediastinal contours are within normal limits. Aortic atherosclerosis. No evidence of acute pulmonary infiltrate or edema. No evidence of pleural effusion. A small nodular density is seen in right lower lobe, as seen on  recent PET-CT. The visualized skeletal structures are unremarkable. IMPRESSION: 1. No acute findings. 2. Right lower lobe pulmonary nodule, as seen on recent PET-CT. Electronically Signed   By: Danae Orleans M.D.   On: 03/10/2019 14:20   Dg Hip Operative Unilat W Or W/o Pelvis Left  Result Date: 03/11/2019 CLINICAL DATA:  LEFT hip arthroplasty EXAM: OPERATIVE LEFT HIP (WITH PELVIS IF PERFORMED) 3 VIEWS TECHNIQUE: Fluoroscopic spot image(s) were submitted for interpretation post-operatively. COMPARISON:  03/10/2019 FINDINGS: LEFT total hip arthroplasty identified without complicating features. IMPRESSION: LEFT total hip arthroplasty without complicating features Electronically Signed   By: Harmon Pier M.D.   On: 03/11/2019 13:50       Discharge Exam: Vitals:   03/12/19 2202 03/13/19 0557  BP: 138/85 (!) 165/98  Pulse: 91 98  Resp:    Temp: 98.4 F (36.9 C) 98.7 F (37.1 C)  SpO2: 98% 97%    General: Pt is alert, awake, not in  acute distress Cardiovascular: RRR, S1/S2 +, no rubs, no gallops Respiratory: CTA bilaterally, no wheezing, no rhonchi Abdominal: Soft, NT, ND, bowel sounds + Extremities: no edema, no cyanosis    The results of significant diagnostics from this hospitalization (including imaging, microbiology, ancillary and laboratory) are listed below for reference.     Microbiology: Recent Results (from the past 240 hour(s))  SARS Coronavirus 2 (CEPHEID- Performed in [facility] hospital lab), Hosp Order     Status: None   Collection Time: 03/10/19 11:31 AM   Specimen: Nasopharyngeal Swab  Result Value Ref Range Status   SARS Coronavirus 2 NEGATIVE NEGATIVE Final    Comment: (NOTE) If result is NEGATIVE SARS-CoV-2 target nucleic acids are NOT DETECTED. The SARS-CoV-2 RNA is generally detectable in upper and lower  respiratory specimens during the acute phase of infection. The lowest  concentration of SARS-CoV-2 viral copies this assay can detect is 250  copies / mL.  A negative result does not preclude SARS-CoV-2 infection  and should not be used as the sole basis for treatment or other  patient management decisions.  A negative result may occur with  improper specimen collection / handling, submission of specimen other  than nasopharyngeal swab, presence of viral mutation(s) within the  areas targeted by this assay, and inadequate number of viral copies  (<250 copies / mL). A negative result must be combined with clinical  observations, patient history, and epidemiological information. If result is POSITIVE SARS-CoV-2 target nucleic acids are DETECTED. The SARS-CoV-2 RNA is generally detectable in upper and lower  respiratory specimens dur ing the acute phase of infection.  Positive  results are indicative of active infection with SARS-CoV-2.  Clinical  correlation with patient history and other diagnostic information is  necessary to determine patient infection status.  Positive results do  not rule out bacterial infection or co-infection with other viruses. If result is PRESUMPTIVE POSTIVE SARS-CoV-2 nucleic acids MAY BE PRESENT.   A presumptive positive result was obtained on the submitted specimen  and confirmed on repeat testing.  While 2019 novel coronavirus  (SARS-CoV-2) nucleic acids may be present in the submitted sample  additional confirmatory testing may be necessary for epidemiological  and / or clinical management purposes  to differentiate between  SARS-CoV-2 and other Sarbecovirus currently known to infect humans.  If clinically indicated additional testing with an alternate test  methodology [phone]([id number] is advised. The SARS-CoV-2 RNA is generally  detectable in upper and lower respiratory sp ecimens during the acute  phase of infection. The expected result is Negative. Fact Sheet for Patients:  BoilerBrush.com.cyhttps://www.fda.gov/media/136312/download Fact Sheet for Healthcare Providers: https://pope.com/https://www.fda.gov/media/136313/download This test is not  yet approved or cleared by the [place] FDA and has been authorized for detection and/or diagnosis of SARS-CoV-2 by FDA under an Emergency Use Authorization (EUA).  This EUA will remain in effect (meaning this test can be used) for the duration of the COVID-19 declaration under Section 564(b)(1) of the Act, 21 U.S.C. section 360bbb-3(b)(1), unless the authorization is terminated or revoked sooner. Performed at [facility], [address] [place], [place] [address]   Surgical pcr screen     Status: None   Collection Time: 03/11/19  9:01 AM   Specimen: Nasal Mucosa; Nasal Swab  Result Value Ref Range Status   MRSA, PCR NEGATIVE NEGATIVE Final   Staphylococcus aureus NEGATIVE NEGATIVE Final    Comment: (NOTE) The Xpert SA Assay (FDA approved for NASAL specimens in patients 72 years of age and older), is  one component of a comprehensive surveillance program. It is not intended to diagnose infection nor to guide or monitor treatment. Performed at [facility], [place] [address] [place], [place] [address]      Labs: BNP (last 3 results) No results for input(s): BNP in the last 8760 hours. Basic Metabolic Panel: Recent Labs  Lab 03/10/19 1646 03/10/19 2036 03/11/19 0251 03/11/19 0715 03/11/19 1036 03/12/19 0328 03/13/19 0258  NA 122* 122* 124* 126*  --  132* 135  K 4.3 4.0 4.1 3.8  --  4.1 3.7  CL 93* 94* 97* 97*  --  103 107  CO2 16* 14* 16* 15*  --  17* 19*  GLUCOSE 90 81 80 82  --  115* 102*  BUN 33* 31* 29* 28*  --  26* 24*  CREATININE 1.80* 1.92* 1.74* 1.70*  --  1.46* 1.29*  CALCIUM 7.7* 7.8* 7.9* 8.0*  --  8.0* 8.2*  MG 1.5*  --   --   --  2.1  --   --   PHOS 3.4  --   --   --   --   --   --    Liver Function Tests: Recent Labs  Lab 03/10/19 1115 03/12/19 0328 03/13/19 0258  AST 28 33 35  ALT 18 18 9   ALKPHOS 77 42 44  BILITOT 1.7* 1.0 1.2  PROT 6.8 5.0* 5.2*  ALBUMIN 3.4* 2.7* 2.7*   Recent Labs  Lab 03/10/19 1115  LIPASE 33   Recent Labs   Lab 03/10/19 1115  AMMONIA 18   CBC: Recent Labs  Lab 03/10/19 1115 03/11/19 0715 03/11/19 1036 03/12/19 0328 03/12/19 1652 03/13/19 0258  WBC 8.5 5.6  --  5.4 5.3 4.8  NEUTROABS 7.9*  --   --  4.7  --  3.4  HGB 9.7* 8.7*  --  6.4* 9.9* 9.2*  HCT 27.5* 24.8* 23.6* 18.5* 28.3* 26.7*  MCV 106.2* 105.5*  --  107.6* 98.3 97.8  PLT 193 174  --  149* 161 164   Cardiac Enzymes: No results for input(s): CKTOTAL, CKMB, CKMBINDEX, TROPONINI in the last 168 hours. BNP: Invalid input(s): POCBNP CBG: No results for input(s): GLUCAP in the last 168 hours. D-Dimer No results for input(s): DDIMER in the last 72 hours. Hgb A1c No results for input(s): HGBA1C in the last 72 hours. Lipid Profile No results for input(s): CHOL, HDL, LDLCALC, TRIG, CHOLHDL, LDLDIRECT in the last 72 hours. Thyroid function studies Recent Labs    03/10/19 1118  TSH 1.518   Anemia work up Recent Labs    03/11/19 0715 03/11/19 1036  VITAMINB12  --  76*  FERRITIN 318  --   TIBC 249*  --   IRON 47  --    Urinalysis    Component Value Date/Time   COLORURINE YELLOW 03/10/2019 [place] 03/10/2019 1317   LABSPEC 1.012 03/10/2019 1317   PHURINE 5.0 03/10/2019 1317   GLUCOSEU NEGATIVE 03/10/2019 1317   HGBUR MODERATE (A) 03/10/2019 1317   BILIRUBINUR NEGATIVE 03/10/2019 1317   KETONESUR 5 (A) 03/10/2019 1317   PROTEINUR NEGATIVE 03/10/2019 1317   NITRITE NEGATIVE 03/10/2019 1317   LEUKOCYTESUR NEGATIVE 03/10/2019 1317   Sepsis Labs Invalid input(s): PROCALCITONIN,  WBC,  LACTICIDVEN Microbiology Recent Results (from the past 240 hour(s))  SARS Coronavirus 2 (CEPHEID- Performed in [place] hospital lab), Hosp Order     Status: None   Collection Time: 03/10/19 11:31 AM   Specimen: Nasopharyngeal  Swab  Result Value Ref Range Status   SARS Coronavirus 2 NEGATIVE NEGATIVE Final    Comment: (NOTE) If result is NEGATIVE SARS-CoV-2 target nucleic acids are NOT DETECTED. The  SARS-CoV-2 RNA is generally detectable in upper and lower  respiratory specimens during the acute phase of infection. The lowest  concentration of SARS-CoV-2 viral copies this assay can detect is 250  copies / mL. A negative result does not preclude SARS-CoV-2 infection  and should not be used as the sole basis for treatment or other  patient management decisions.  A negative result may occur with  improper specimen collection / handling, submission of specimen other  than nasopharyngeal swab, presence of viral mutation(s) within the  areas targeted by this assay, and inadequate number of viral copies  (<250 copies / mL). A negative result must be combined with clinical  observations, patient history, and epidemiological information. If result is POSITIVE SARS-CoV-2 target nucleic acids are DETECTED. The SARS-CoV-2 RNA is generally detectable in upper and lower  respiratory specimens dur ing the acute phase of infection.  Positive  results are indicative of active infection with SARS-CoV-2.  Clinical  correlation with patient history and other diagnostic information is  necessary to determine patient infection status.  Positive results do  not rule out bacterial infection or co-infection with other viruses. If result is PRESUMPTIVE POSTIVE SARS-CoV-2 nucleic acids MAY BE PRESENT.   A presumptive positive result was obtained on the submitted specimen  and confirmed on repeat testing.  While 2019 novel coronavirus  (SARS-CoV-2) nucleic acids may be present in the submitted sample  additional confirmatory testing may be necessary for epidemiological  and / or clinical management purposes  to differentiate between  SARS-CoV-2 and other Sarbecovirus currently known to infect humans.  If clinically indicated additional testing with an alternate test  methodology [phone]([id number]) is advised. The SARS-CoV-2 RNA is generally  detectable in upper and lower respiratory sp ecimens during the acute   phase of infection. The expected result is Negative. Fact Sheet for Patients:  BoilerBrush.com.cyhttps://www.fda.gov/media/136312/download Fact Sheet for Healthcare Providers: https://pope.com/https://www.fda.gov/media/136313/download This test is not yet approved or cleared by the [place] FDA and has been authorized for detection and/or diagnosis of SARS-CoV-2 by FDA under an Emergency Use Authorization (EUA).  This EUA will remain in effect (meaning this test can be used) for the duration of the COVID-19 declaration under Section 564(b)(1) of the Act, 21 U.S.C. section 360bbb-3(b)(1), unless the authorization is terminated or revoked sooner. Performed at [facility], [address] [place], [place] [address]   Surgical pcr screen     Status: None   Collection Time: 03/11/19  9:01 AM   Specimen: Nasal Mucosa; Nasal Swab  Result Value Ref Range Status   MRSA, PCR NEGATIVE NEGATIVE Final   Staphylococcus aureus NEGATIVE NEGATIVE Final    Comment: (NOTE) The Xpert SA Assay (FDA approved for NASAL specimens in patients 72 years of age and older), is one component of a comprehensive surveillance program. It is not intended to diagnose infection nor to guide or monitor treatment. Performed at [facility], [address] [place], [place] [address]       Patient was seen and examined on the day of discharge and was found to be in stable condition. Time coordinating discharge: 35 minutes including assessment and coordination of care, as well as examination of the patient.   SIGNED:  Noralee StainJennifer Vassie Kugel, DO [facility] [email or web address] 03/13/2019, 10:56 AM

## 2019-03-13 NOTE — Progress Notes (Signed)
SATURATION QUALIFICATIONS: (This note is used to comply with regulatory documentation for home oxygen)  Patient Saturations on Room Air at Rest = 91%  Patient Saturations on Room Air while Ambulating = 77%  Patient Saturations on 4 Liters of oxygen while Ambulating = 96%  Please briefly explain why patient needs home oxygen:  Pt becomes hypoxic with exertion. He was not on O2 prior to hospitalization. Hx of COPD

## 2019-03-13 NOTE — TOC Transition Note (Signed)
Transition of Care [facility]) - CM/SW Discharge Note   Patient Details  Name: Nathan Sellers MRN: [id number] Date of Birth: 01/05/1946  Transition of Care [facility]) CM/SW Contact:  Sharin Mons, RN Phone Number: 03/13/2019, 12:15 PM   Clinical Narrative:     Pt will transition to home today once home oxygen set up completed. Referral made with [employer]. Pt will need PTAR services for transportation to home. NCM will continue to monitor and arrange transportation services @ the appointed time. Transportation papers placed on front of chart. Whitman Hero RN,BSN,CM    Barriers to Discharge: Continued Medical Work up   Patient Goals and CMS Choice Patient states their goals for this hospitalization and ongoing recovery are:: to get better CMS Medicare.gov Compare Post Acute Care list provided to:: Patient Choice offered to / list presented to : Patient  Discharge Placement                       Discharge Plan and Services   Discharge Planning Services: CM Consult            DME Arranged: Oxygen DME Agency: [employer] Date DME Agency Contacted: 03/13/19 Time DME Agency Contacted: 1212 Representative spoke with at DME Agency: [place]: [place] Date [place]: 03/12/19 Time [place]: 1215 Representative spoke with at [place]: [place] Determinants of Health ([place]) Interventions     Readmission Risk Interventions No flowsheet data found.

## 2019-03-13 NOTE — Care Management Important Message (Signed)
Important Message  Patient Details  Name: Nathan Sellers MRN: [id number] Date of Birth: Aug 30, 1945   Medicare Important Message Given:  Yes     Orbie Pyo 03/13/2019, 2:18 PM

## 2019-03-13 NOTE — Progress Notes (Signed)
Physical Therapy Treatment Patient Details Name: Nathan Sellers Roger ShelterMRN: [id number] DOB: 1946-07-29 Today's Date: 03/13/2019    History of Present Illness Patient is a 73 year old male admitted after fall. His fall was last Thursday and he had been unable to ambulate. patient found to have femoral neck fx, he is now s/p left THA, anterior approach. PMH to include: COPD, HTN, multiple falls, HOH, CKD, ETOH.    PT Comments    Pt with good participation and motivation, able to increase gait distance to 3750' with RW this session. Limited by SOB, spO2 90% on 2LO2.    Follow Up Recommendations  Home health PT     Equipment Recommendations  None recommended by PT    Recommendations for Other Services       Precautions / Restrictions Precautions Precautions: Anterior Hip;Fall Restrictions LLE Weight Bearing: Weight bearing as tolerated    Mobility  Bed Mobility               General bed mobility comments: increased time, use of bed rails  Transfers Overall transfer level: Needs assistance Equipment used: Rolling walker (2 wheeled) Transfers: Sit to/from Stand Sit to Stand: Supervision            Ambulation/Gait Ambulation/Gait assistance: Supervision Gait Distance (Feet): 50 Feet Assistive device: Rolling walker (2 wheeled)       General Gait Details: good safety and ability   Stairs             Wheelchair Mobility    Modified Rankin (Stroke Patients Only)       Balance                                            Cognition Arousal/Alertness: Awake/alert Behavior During Therapy: WFL for tasks assessed/performed Overall Cognitive Status: Within Functional Limits for tasks assessed                                        Exercises General Exercises - Lower Extremity Ankle Circles/Pumps: 20 reps Long Arc Quad: 20 reps;Both Hip Flexion/Marching: 20 reps;Both    General Comments        Pertinent Vitals/Pain  Faces Pain Scale: Hurts a little bit Pain Location: L hip Pain Descriptors / Indicators: Aching Pain Intervention(s): Monitored during session    Home Living                      Prior Function            PT Goals (current goals can now be found in the care plan section) Progress towards PT goals: Progressing toward goals    Frequency    BID      PT Plan Current plan remains appropriate    Co-evaluation              AM-PAC PT "6 Clicks" Mobility   Outcome Measure  Help needed turning from your back to your side while in a flat bed without using bedrails?: A Little Help needed moving from lying on your back to sitting on the side of a flat bed without using bedrails?: A Little Help needed moving to and from a bed to a chair (including a wheelchair)?: A Little Help needed standing up from a chair using  your arms (e.g., wheelchair or bedside chair)?: A Little Help needed to walk in hospital room?: A Little Help needed climbing 3-5 steps with a railing? : A Lot 6 Click Score: 17    End of Session Equipment Utilized During Treatment: Gait belt Activity Tolerance: Patient tolerated treatment well Patient left: in chair;with call bell/phone within reach   PT Visit Diagnosis: Muscle weakness (generalized) (M62.81);Difficulty in walking, not elsewhere classified (R26.2);Pain;History of falling (Z91.81) Pain - part of body: Hip     Time: [id number] PT Time Calculation (min) (ACUTE ONLY): 24 min  Charges:  $Gait Training: 8-22 mins $Therapeutic Exercise: 8-22 mins                    Isabelle Course, PT, DPT   Tanyon Alipio 03/13/2019, 10:23 AM

## 2019-03-13 NOTE — Progress Notes (Signed)
Over morning RN has had multiple phone calls with pt's wife reviewing his baseline. RN paged pt's MD and PT for them to call her and clarify some further concerns she had - they agreed to do so.   RN norified CM/SW Reino Bellis MD of pt's need of home O2.  Will continue to monitor pt.

## 2019-04-11 ENCOUNTER — Encounter: Payer: Self-pay | Admitting: *Deleted

## 2019-04-11 NOTE — Progress Notes (Signed)
  Oncology Nurse Navigator Documentation  Navigator Location: [facility] (04/11/19 1400) Referral Date to RadOnc/MedOnc: 04/11/19 (04/11/19 1400) )Navigator Encounter Type: Introductory Phone Call (04/11/19 1400)   Abnormal Finding Date: 07/25/18 (04/11/19 1400)                   Treatment Phase: Abnormal Scans (04/11/19 1400) Barriers/Navigation Needs: Coordination of Care (04/11/19 1400)   Interventions: Coordination of Care (04/11/19 1400)   Coordination of Care: Appts (04/11/19 1400)        Acuity: Level 2 (04/11/19 1400)   Acuity Level 2: Initial guidance, education and coordination as needed;Educational needs;Assistance expediting appointments (04/11/19 1400)    phone call made to patient to introduce to navigator services and review upcoming appts with oncology. Spoke with pt's wife since pt was not available during phone call. Referral received from PCP asking for pt to seen quickly. Appt available on 8/14 at 9am with Dr. Rogue Bussing but pt's wife stated they need something next Friday, 8/21. Pt scheduled to see Dr. Jacinto Reap on 8/21 at 3pm. Per pt's wife, pt is reluctant on seeing any more doctors at this time and she would need to convince him to come. Contact info given and instructed to call if needs to cancel/reschedule appt. Pt's wife verbalized understanding. Nothing further needed at this time.  Time Spent with Patient: 30 (04/11/19 1400)

## 2019-04-16 ENCOUNTER — Telehealth: Payer: Self-pay | Admitting: *Deleted

## 2019-04-16 NOTE — Telephone Encounter (Signed)
Patients wife called in to report she did not think patient would keep appointment on 8/21. I asked patient if she would like to keep scheduled for now in case patient decides to come in, she agrees. Angie Fava is going to follow up with wife on Thursday to see if patient will be coming in for appointment.

## 2019-04-19 ENCOUNTER — Inpatient Hospital Stay: Payer: Medicare HMO | Admitting: Internal Medicine
